# Patient Record
Sex: Male | Born: 1970 | Race: Black or African American | Hispanic: No | Marital: Single | Smoking: Former smoker
Health system: Southern US, Community
[De-identification: ages and names within clinical notes are randomized; demographics above are authoritative.]

## PROBLEM LIST (undated history)

## (undated) DIAGNOSIS — H409 Unspecified glaucoma: Secondary | ICD-10-CM

## (undated) DIAGNOSIS — D649 Anemia, unspecified: Secondary | ICD-10-CM

## (undated) DIAGNOSIS — T7840XA Allergy, unspecified, initial encounter: Secondary | ICD-10-CM

## (undated) HISTORY — DX: Allergy, unspecified, initial encounter: T78.40XA

## (undated) HISTORY — DX: Anemia, unspecified: D64.9

## (undated) HISTORY — DX: Unspecified glaucoma: H40.9

---

## 2002-10-17 ENCOUNTER — Emergency Department (HOSPITAL_COMMUNITY): Admission: EM | Admit: 2002-10-17 | Discharge: 2002-10-17 | Payer: Self-pay | Admitting: Emergency Medicine

## 2011-04-06 ENCOUNTER — Other Ambulatory Visit: Payer: Self-pay | Admitting: Physician Assistant

## 2011-05-09 ENCOUNTER — Ambulatory Visit (INDEPENDENT_AMBULATORY_CARE_PROVIDER_SITE_OTHER): Admitting: Family Medicine

## 2011-05-09 VITALS — BP 124/77 | HR 54 | Temp 98.2°F | Resp 16 | Ht 70.25 in | Wt 237.4 lb

## 2011-05-09 DIAGNOSIS — J019 Acute sinusitis, unspecified: Secondary | ICD-10-CM

## 2011-05-09 DIAGNOSIS — M545 Low back pain, unspecified: Secondary | ICD-10-CM

## 2011-05-09 DIAGNOSIS — M5430 Sciatica, unspecified side: Secondary | ICD-10-CM

## 2011-05-09 DIAGNOSIS — J069 Acute upper respiratory infection, unspecified: Secondary | ICD-10-CM

## 2011-05-09 DIAGNOSIS — M5432 Sciatica, left side: Secondary | ICD-10-CM

## 2011-05-09 MED ORDER — FLUTICASONE PROPIONATE 50 MCG/ACT NA SUSP: 2.0000 | Freq: Every day | NASAL | Status: AC

## 2011-05-09 MED ORDER — AMOXICILLIN 875 MG PO TABS: 875.0000 mg | ORAL_TABLET | Freq: Two times a day (BID) | ORAL | Status: AC

## 2011-05-09 MED ORDER — PREDNISONE 20 MG PO TABS: ORAL_TABLET | ORAL | Status: DC

## 2011-05-09 MED ORDER — OXAPROZIN 600 MG PO TABS: ORAL_TABLET | ORAL | Status: DC

## 2011-05-09 NOTE — Progress Notes (Signed)
Subjective: Patient has had several weeks of right upper Sartor infection. It is gone through phases of being clear to yellow and bloody and brown. He just stayed constantly congested and sniffling. He has not been on any major medications for this. He does not smoke. He works a Office manager. His girlfriend is starting to get some respiratory tract infection now to  He has had low back pain problems since he came back from [place] 6 years ago. He has radiation of the pain down the back of his left leg. Actually it has some numbness to his left foot. This has persisted, and he has to take caution on Lollie Sails uses his leg and how quickly move some things to try to keep from hurting it worse. He has a card for the [place], but has not had his back fully checked out yet. Last year I gave him a course of prednisone and he had some improvement from that.  Objective: Looks and sounds congested. TMs are normal. Just a small amount of wax in ear canals. Nose very congested. Throat was clear. Neck supple without significant nodes. Chest is clear to auscultation. Heart regular without murmurs. Very stopped up in his sinuses but not particularly tender over them.  Abdomen was soft extremities unremarkable but straight leg raising test causes some discomfort at about 70 or 80 on the left he tightens up a little bit.  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  Plan: We'll treat the sinusitis and will give him a course of prednisone for his back which should help the sinuses also. If he is getting worse he is to come back and. If he is able to he is to go [facility].  Onalee Hua a handwritten excuse through Wednesday for him not have to run at school he is at. If his back is bothering him too much or his sinuses are still not cleared considerably we would have to extend this for a few days. Thank you

## 2011-05-09 NOTE — Patient Instructions (Signed)
Sinusitis Sinuses are air pockets within the bones of your face. The growth of bacteria within a sinus leads to infection. The infection prevents the sinuses from draining. This infection is called sinusitis. SYMPTOMS  There will be different areas of pain depending on which sinuses have become infected.  The maxillary sinuses often produce pain beneath the eyes.   Frontal sinusitis may cause pain in the middle of the forehead and above the eyes.  Other problems (symptoms) include:  Toothaches.   Colored, pus-like (purulent) drainage from the nose.   Swelling, warmth, and tenderness over the sinus areas may be signs of infection.  TREATMENT  Sinusitis is most often determined by an exam.X-rays may be taken. If x-rays have been taken, make sure you obtain your results or find out how you are to obtain them. Your caregiver may give you medications (antibiotics). These are medications that will help kill the bacteria causing the infection. You may also be given a medication (decongestant) that helps to reduce sinus swelling.  HOME CARE INSTRUCTIONS   Only take over-the-counter or prescription medicines for pain, discomfort, or fever as directed by your caregiver.   Drink extra fluids. Fluids help thin the mucus so your sinuses can drain more easily.   Applying either moist heat or ice packs to the sinus areas may help relieve discomfort.   Use saline nasal sprays to help moisten your sinuses. The sprays can be found at your local drugstore.  SEEK IMMEDIATE MEDICAL CARE IF:  You have a fever.   You have increasing pain, severe headaches, or toothache.   You have nausea, vomiting, or drowsiness.   You develop unusual swelling around the face or trouble seeing.  MAKE SURE YOU:   Understand these instructions.   Will watch your condition.   Will get help right away if you are not doing well or get worse.  Document Released: 02/16/2005 Document Revised: 02/05/2011 Document Reviewed:  09/15/2006 [facility] Patient Information 2012 [place], [place].   Sciatica Sciatica is a weakness and/or changes in sensation (tingling, jolts, hot and cold, numbness) along the path the sciatic nerve travels. Irritation or damage to lumbar nerve roots is often also referred to as lumbar radiculopathy.  Lumbar radiculopathy (Sciatica) is the most common form of this problem. Radiculopathy can occur in any of the nerves coming out of the spinal cord. The problems caused depend on which nerves are involved. The sciatic nerve is the large nerve supplying the branches of nerves going from the hip to the toes. It often causes a numbness or weakness in the skin and/or muscles that the sciatic nerve serves. It also may cause symptoms (problems) of pain, burning, tingling, or electric shock-like feelings in the path of this nerve. This usually comes from injury to the fibers that make up the sciatic nerve. Some of these symptoms are low back pain and/or unpleasant feelings in the following areas:  From the mid-buttock down the back of the leg to the back of the knee.   And/or the outside of the calf and top of the foot.   And/or behind the inner ankle to the sole of the foot.  CAUSES   Herniated or slipped disc. Discs are the little cushions between the bones in the back.   Pressure by the piriformis muscle in the buttock on the sciatic nerve (Piriformis Syndrome).   Misalignment of the bones in the lower back and buttocks (Sacroiliac Joint Derangement).   Narrowing of the spinal canal that puts pressure  on or pinches the fibers that make up the sciatic nerve.   A slipped vertebra that is out of line with those above or beneath it.   Abnormality of the nervous system itself so that nerve fibers do not transmit signals properly, especially to feet and calves (neuropathy).   Tumor (this is rare).  Your caregiver can usually determine the cause of your sciatica and begin the treatment most likely to  help you. TREATMENT  Taking over-the-counter painkillers, physical therapy, rest, exercise, spinal manipulation, and injections of anesthetics and/or steroids may be used. Surgery, acupuncture, and Yoga can also be effective. Mind over matter techniques, mental imagery, and changing factors such as your bed, chair, desk height, posture, and activities are other treatments that may be helpful. You and your caregiver can help determine what is best for you. With proper diagnosis, the cause of most sciatica can be identified and removed. Communication and cooperation between your caregiver and you is essential. If you are not successful immediately, do not be discouraged. With time, a proper treatment can be found that will make you comfortable. HOME CARE INSTRUCTIONS   If the pain is coming from a problem in the back, applying ice to that area for 15 to 20 minutes, 3 to 4 times per day while awake, may be helpful. Put the ice in a plastic bag. Place a towel between the bag of ice and your skin.   You may exercise or perform your usual activities if these do not aggravate your pain, or as suggested by your caregiver.   Only take over-the-counter or prescription medicines for pain, discomfort, or fever as directed by your caregiver.   If your caregiver has given you a follow-up appointment, it is very important to keep that appointment. Not keeping the appointment could result in a chronic or permanent injury, pain, and disability. If there is any problem keeping the appointment, you must call back to this facility for assistance.  SEEK IMMEDIATE MEDICAL CARE IF:   You experience loss of control of bowel or bladder.   You have increasing weakness in the trunk, buttocks, or legs.   There is numbness in any areas from the hip down to the toes.   You have difficulty walking or keeping your balance.   You have any of the above, with fever or forceful vomiting.  Document Released: 02/10/2001 Document  Revised: 02/05/2011 Document Reviewed: 09/30/2007 [facility] Patient Information 2012 [place], [place].

## 2012-03-04 ENCOUNTER — Other Ambulatory Visit: Payer: Self-pay | Admitting: Family Medicine

## 2012-04-28 ENCOUNTER — Ambulatory Visit (INDEPENDENT_AMBULATORY_CARE_PROVIDER_SITE_OTHER): Admitting: Family Medicine

## 2012-04-28 VITALS — BP 127/80 | HR 75 | Temp 98.0°F | Resp 16 | Ht 70.5 in | Wt 238.0 lb

## 2012-04-28 DIAGNOSIS — G4733 Obstructive sleep apnea (adult) (pediatric): Secondary | ICD-10-CM

## 2012-04-28 DIAGNOSIS — IMO0002 Reserved for concepts with insufficient information to code with codable children: Secondary | ICD-10-CM

## 2012-04-28 DIAGNOSIS — S76219A Strain of adductor muscle, fascia and tendon of unspecified thigh, initial encounter: Secondary | ICD-10-CM

## 2012-04-28 NOTE — Patient Instructions (Addendum)
Let me know if you [facility]  Try and rest the groin.  Take ibuprofen 800 mg maximum of 3 times in 24 hours for pain and inflammation

## 2012-04-28 NOTE — Progress Notes (Signed)
Subjective: Patient is here with 2 main complaints. He has, a very bad snorer. He says he shakes the house. Others have reported that he stops breathing sometimes during his sleep. He does have daytime drowsiness. He is overweight. He does a lot of non-cardio exercises.  His second problem is that he was pushing a dresser this past weekend and pulled his left groin. He continues to hurt on the left medial thigh whenever he twists his leg or moves in certain positions. He is walking with a limp rate he realizes that it will take some time for this to heal, but he is supposed to go to [employer] reserves week starting this weekend. He realizes that he cannot do the running or leg work that he may need to. He would like an excuse for that.  Objective: He does walk with a limp, favoring the left leg. No hernias. Testes normal. He is tender on his left medial thigh, a couple of inches below the groin. External rotation and abduction seem to cause much pain.  Throat clear. Neck supple without nodes. Chest clear. Heart regular without murmurs. Abdomen soft nontender.  Assessment: Sleep apnea Left groin strain  Plan: Wrote a note for the [employer] Make a referral for sleep apnea testing. Retinal referral note that he can send to the [place] and see if he can get him to do it. If not he is to contact us and we will make a referral for a split study sleep test here in  [place].

## 2012-07-11 ENCOUNTER — Ambulatory Visit

## 2012-07-12 ENCOUNTER — Telehealth: Payer: Self-pay

## 2012-07-12 ENCOUNTER — Ambulatory Visit: Admitting: Physician Assistant

## 2012-07-12 VITALS — BP 126/92 | HR 91 | Temp 98.7°F | Resp 16 | Ht 69.75 in | Wt 229.0 lb

## 2012-07-12 DIAGNOSIS — E119 Type 2 diabetes mellitus without complications: Secondary | ICD-10-CM

## 2012-07-12 DIAGNOSIS — R0989 Other specified symptoms and signs involving the circulatory and respiratory systems: Secondary | ICD-10-CM

## 2012-07-12 DIAGNOSIS — R7309 Other abnormal glucose: Secondary | ICD-10-CM

## 2012-07-12 DIAGNOSIS — R0683 Snoring: Secondary | ICD-10-CM

## 2012-07-12 DIAGNOSIS — R739 Hyperglycemia, unspecified: Secondary | ICD-10-CM

## 2012-07-12 DIAGNOSIS — H409 Unspecified glaucoma: Secondary | ICD-10-CM | POA: Insufficient documentation

## 2012-07-12 DIAGNOSIS — E7800 Pure hypercholesterolemia, unspecified: Secondary | ICD-10-CM

## 2012-07-12 LAB — LIPID PANEL
HDL: 41 mg/dL (ref >39)
Total CHOL/HDL Ratio: 5.3 Ratio
VLDL: 16 mg/dL (ref 0–40)

## 2012-07-12 LAB — COMPREHENSIVE METABOLIC PANEL
ALT: 17 U/L (ref 0–53)
AST: 14 U/L (ref 0–37)
CO2: 28 mEq/L (ref 19–32)
Calcium: 10.2 mg/dL (ref 8.4–10.5)
Chloride: 100 mEq/L (ref 96–112)
Creat: 1 mg/dL (ref 0.50–1.35)
Potassium: 4.3 mEq/L (ref 3.5–5.3)
Sodium: 138 mEq/L (ref 135–145)
Total Protein: 7.7 g/dL (ref 6.0–8.3)

## 2012-07-12 MED ORDER — METFORMIN HCL ER 500 MG PO TB24: 500.0000 mg | ORAL_TABLET | Freq: Every day | ORAL | Status: DC

## 2012-07-12 NOTE — Telephone Encounter (Signed)
THIS MESSAGE IS TO [name]:   Shawn Pollard CALLED YOU BACK. THE NAME OF HIS GLUCOSE METER IS CONTOUR. THE LANCES - THE STRIP ON THE SIDE HAS A NUMBER OF [id number]. HE WOULD LIKE YOU TO CALL IT INTO THE [facility] ON [address]. HE ALSO NEEDS IT SENT TO HIS [employer]. HIS SS # IS [id number]. HIS [employer] ID# IS: [id number]. IF YOU NEEDS ANY OTHER INFORMATION REGARDING THE [employer], PLEASE CALL HIM. BEST PHONE [phone] (CELL)  PHARMACY CHOICE IS [facility] ON [address].   MBC

## 2012-07-12 NOTE — Progress Notes (Signed)
   [address], [place] [address]   Phone [phone]  Subjective:    Patient ID: Shawn Pollard, male    DOB: April 28, 1970, 42 y.o.   MRN: [id number]  HPI  Pt presents to clinic for repeat labs.  He had labs done in March at the [facility] for a physical and his glucose and cholesterol were high. He would like to have them repeated.  He has been checking his sugars and some days they are >150 other days low 100s fasting.  The other day he had a reading 270.  He has changed some of how he eats, less sweets and more fruits and veggies.  He has not lost any weight.  He sees an eye Dr for his glaucoma and he stated that he had some changes consistent with diabetes.  Pt is unable to get a sleep study through the [place] so he would like [place] to set that up.  Review of Systems  Eyes: Negative for visual disturbance.  Cardiovascular: Negative for chest pain.  Neurological: Negative for numbness (foot).       Objective:   Physical Exam  Vitals reviewed. Constitutional: He is oriented to person, place, and time. He appears well-developed and well-nourished.  HENT:  Head: Normocephalic and atraumatic.  Right Ear: External ear normal.  Left Ear: External ear normal.  Pulmonary/Chest: Effort normal.  Neurological: He is alert and oriented to person, place, and time.  Skin: Skin is warm and dry.  Psychiatric: He has a normal mood and affect. His behavior is normal. Judgment and thought content normal.      Assessment & Plan:  Hyperglycemia - Plan: POCT glycosylated hemoglobin (Hb A1C), Comprehensive metabolic panel  Hypercholesterolemia - will wait for his labs to return but I expect pt to need medications - Plan: Lipid panel  Snoring - Plan: Ambulatory referral to Sleep Studies  Diabetes mellitus, type - new diagnosis d/w pt at length eating strategies and pt will them for 3 months and then he will f/u with me - if he would llike more help at that time he we refer him to DM nutrition counseling - Plan:  metFORMIN (GLUCOPHAGE XR) 500 MG 24 hr tablet (he will start with a pill qd for the 1st week and then he will increase to bid).  He will call me with his meter type so we can refill his strips.  He will RTC in 3 months - and then we will probably add lisinopril at that time as well as get his Pneumovax.  Pt had an EKG at the [place] and I have scanned a copy into our chart for reference.  Benny Lennert PA-C 07/12/2012 4:22 PM

## 2012-07-13 MED ORDER — ATORVASTATIN CALCIUM 20 MG PO TABS: 20.0000 mg | ORAL_TABLET | Freq: Every day | ORAL | Status: DC

## 2012-07-13 MED ORDER — GLUCOSE BLOOD VI STRP: ORAL_STRIP | Status: DC

## 2012-07-13 MED ORDER — ONETOUCH ULTRASOFT LANCETS MISC: Status: DC

## 2012-07-13 NOTE — Telephone Encounter (Signed)
Printed these will fax.

## 2012-07-13 NOTE — Addendum Note (Signed)
Addended by: Morrell Riddle on: 07/13/2012 10:40 AM   Modules accepted: Orders

## 2012-07-15 ENCOUNTER — Ambulatory Visit: Admitting: Family Medicine

## 2012-09-27 ENCOUNTER — Institutional Professional Consult (permissible substitution): Admitting: Neurology

## 2012-09-27 ENCOUNTER — Encounter: Payer: Self-pay | Admitting: Neurology

## 2012-10-06 ENCOUNTER — Other Ambulatory Visit: Payer: Self-pay | Admitting: Physician Assistant

## 2012-10-07 NOTE — Telephone Encounter (Signed)
Needs office visit.

## 2012-10-24 ENCOUNTER — Encounter: Payer: Self-pay | Admitting: Physician Assistant

## 2012-11-26 ENCOUNTER — Other Ambulatory Visit: Payer: Self-pay | Admitting: Physician Assistant

## 2012-12-15 ENCOUNTER — Telehealth: Payer: Self-pay

## 2012-12-15 NOTE — Telephone Encounter (Signed)
Pt would like for someone to call him back to see if Weber needs to see him on like a 3 or 6 month bases Call back number is [phone]

## 2012-12-15 NOTE — Telephone Encounter (Signed)
When Shawn Pollard last saw him, she indicated recheck in 3 months. Called him to advise. He was transferred to make appt.

## 2012-12-16 ENCOUNTER — Other Ambulatory Visit: Payer: Self-pay

## 2012-12-16 DIAGNOSIS — E119 Type 2 diabetes mellitus without complications: Secondary | ICD-10-CM

## 2012-12-16 MED ORDER — METFORMIN HCL ER 500 MG PO TB24: 500.0000 mg | ORAL_TABLET | Freq: Two times a day (BID) | ORAL | Status: DC

## 2012-12-16 NOTE — Telephone Encounter (Signed)
Pt has appt sch for 02/08/13. I am sending in RF of metformin

## 2013-01-05 ENCOUNTER — Other Ambulatory Visit: Payer: Self-pay

## 2013-02-08 ENCOUNTER — Ambulatory Visit (INDEPENDENT_AMBULATORY_CARE_PROVIDER_SITE_OTHER): Admitting: Physician Assistant

## 2013-02-08 ENCOUNTER — Encounter: Payer: Self-pay | Admitting: Physician Assistant

## 2013-02-08 VITALS — BP 110/80 | HR 54 | Temp 98.1°F | Resp 16 | Ht 70.0 in | Wt 214.0 lb

## 2013-02-08 DIAGNOSIS — R0683 Snoring: Secondary | ICD-10-CM

## 2013-02-08 DIAGNOSIS — E7800 Pure hypercholesterolemia, unspecified: Secondary | ICD-10-CM

## 2013-02-08 DIAGNOSIS — Z23 Encounter for immunization: Secondary | ICD-10-CM

## 2013-02-08 DIAGNOSIS — R0609 Other forms of dyspnea: Secondary | ICD-10-CM

## 2013-02-08 DIAGNOSIS — E119 Type 2 diabetes mellitus without complications: Secondary | ICD-10-CM

## 2013-02-08 LAB — POCT GLYCOSYLATED HEMOGLOBIN (HGB A1C): Hemoglobin A1C: 6.1

## 2013-02-08 LAB — COMPREHENSIVE METABOLIC PANEL
ALT: 16 U/L (ref 0–53)
Alkaline Phosphatase: 47 U/L (ref 39–117)
CO2: 26 mEq/L (ref 19–32)
Creat: 0.93 mg/dL (ref 0.50–1.35)
Total Bilirubin: 0.6 mg/dL (ref 0.3–1.2)

## 2013-02-08 LAB — LIPID PANEL
Cholesterol: 127 mg/dL (ref 0–200)
LDL Cholesterol: 78 mg/dL (ref 0–99)
Total CHOL/HDL Ratio: 3.2 Ratio
Triglycerides: 43 mg/dL (ref <150)
VLDL: 9 mg/dL (ref 0–40)

## 2013-02-08 MED ORDER — LISINOPRIL 2.5 MG PO TABS: 2.5000 mg | ORAL_TABLET | Freq: Every day | ORAL | Status: DC

## 2013-02-08 MED ORDER — GLUCOSE BLOOD VI STRP: ORAL_STRIP | Status: DC

## 2013-02-08 MED ORDER — METFORMIN HCL ER 500 MG PO TB24: 500.0000 mg | ORAL_TABLET | Freq: Every day | ORAL | Status: DC

## 2013-02-08 NOTE — Progress Notes (Signed)
   Subjective:    Patient ID: Shawn Pollard, male    DOB: [birth year], 42 y.o.   MRN: [id number]  HPI  Pt presents to clinic for recheck.  He has changed his diet significantly and not exercised.  He feels good.  He never went to his sleep study but he has noticed that his snoring has decreased but it is still present and he would like to be referred again for a sleep study.  Review of Systems  Cardiovascular: Negative for chest pain.  Neurological: Negative for numbness.       Objective:   Physical Exam  Vitals reviewed. Constitutional: He is oriented to person, place, and time. He appears well-developed and well-nourished.  HENT:  Head: Normocephalic and atraumatic.  Right Ear: External ear normal.  Left Ear: External ear normal.  Eyes: Conjunctivae are normal.  Neck: Normal range of motion.  Cardiovascular: Normal rate, regular rhythm and normal heart sounds.   No murmur heard. Pulmonary/Chest: Effort normal and breath sounds normal.  Neurological: He is alert and oriented to person, place, and time.  Skin: Skin is warm and dry.  Psychiatric: He has a normal mood and affect. His behavior is normal. Judgment and thought content normal.       Assessment & Plan:  DM type 2 (diabetes mellitus, type 2) - Controlled - Continue daily metformin dose and we will add Lisinopril (very low dose due to normal BP) for kidney protection.  Plan: POCT glycosylated hemoglobin (Hb A1C), metFORMIN (GLUCOPHAGE XR) 500 MG 24 hr tablet, glucose blood test strip, lisinopril (ZESTRIL) 2.5 MG tablet  Pure hypercholesterolemia - check labs and we will adjust medication if necessary.  Plan: Comprehensive metabolic panel, Lipid panel  Snoring - Pt had to cancel his appt but he would like to be referred again.  Plan: Ambulatory referral to Sleep Studies  Flu vaccine need - Plan: Flu Vaccine QUAD 36+ mos IM  Need for pneumococcal vaccine - Plan: Pneumococcal polysaccharide vaccine 23-valent greater than  or equal to 2yo subcutaneous/IM  Recheck in 3 months.  Benny Lennert PA-C 02/08/2013 3:58 PM

## 2013-02-08 NOTE — Patient Instructions (Signed)
Diabetes is well controlled.

## 2013-02-09 ENCOUNTER — Other Ambulatory Visit: Payer: Self-pay | Admitting: Physician Assistant

## 2013-02-09 MED ORDER — ATORVASTATIN CALCIUM 20 MG PO TABS: 20.0000 mg | ORAL_TABLET | Freq: Every day | ORAL | Status: DC

## 2013-02-12 ENCOUNTER — Other Ambulatory Visit: Payer: Self-pay | Admitting: Physician Assistant

## 2013-03-24 ENCOUNTER — Encounter: Payer: Self-pay | Admitting: Neurology

## 2013-03-24 ENCOUNTER — Ambulatory Visit (INDEPENDENT_AMBULATORY_CARE_PROVIDER_SITE_OTHER): Admitting: Neurology

## 2013-03-24 VITALS — BP 114/71 | HR 62 | Resp 17 | Ht 71.0 in | Wt 218.0 lb

## 2013-03-24 DIAGNOSIS — R0609 Other forms of dyspnea: Secondary | ICD-10-CM

## 2013-03-24 DIAGNOSIS — G4710 Hypersomnia, unspecified: Secondary | ICD-10-CM

## 2013-03-24 DIAGNOSIS — R0989 Other specified symptoms and signs involving the circulatory and respiratory systems: Secondary | ICD-10-CM

## 2013-03-24 DIAGNOSIS — R0683 Snoring: Secondary | ICD-10-CM

## 2013-03-24 DIAGNOSIS — G4730 Sleep apnea, unspecified: Secondary | ICD-10-CM

## 2013-03-24 DIAGNOSIS — G4726 Circadian rhythm sleep disorder, shift work type: Secondary | ICD-10-CM

## 2013-03-24 NOTE — Patient Instructions (Signed)
Sleep Apnea  Sleep apnea is disorder that affects a person's sleep. A person with sleep apnea has abnormal pauses in their breathing when they sleep. It is hard for them to get a good sleep. This makes a person tired during the day. It also can lead to other physical problems. There are three types of sleep apnea. One type is when breathing stops for a short time because your airway is blocked (obstructive sleep apnea). Another type is when the brain sometimes fails to give the normal signal to breathe to the muscles that control your breathing (central sleep apnea). The third type is a combination of the other two types.  HOME CARE  · Do not sleep on your back. Try to sleep on your side.  · Take all medicine as told by your doctor.  · Avoid alcohol, calming medicines (sedatives), and depressant drugs.  · Try to lose weight if you are overweight. Talk to your doctor about a healthy weight goal.  Your doctor may have you use a device that helps to open your airway. It can help you get the air that you need. It is called a positive airway pressure (PAP) device. There are three types of PAP devices:  · Continuous positive airway pressure (CPAP) device.  · Nasal expiratory positive airway pressure (EPAP) device.  · Bilevel positive airway pressure (BPAP) device.  MAKE SURE YOU:  · Understand these instructions.  · Will watch your condition.  · Will get help right away if you are not doing well or get worse.  Document Released: 11/26/2007 Document Revised: 02/03/2012 Document Reviewed: 06/20/2011  [employer]® Patient Information ©2014 [employer].

## 2013-03-24 NOTE — Progress Notes (Signed)
[facility]  SLEEP MEDICINE CLINIC  Provider:  Melvyn Novasarmen  Malan Werk, M D  Referring Provider: Larkin InaWeber, Sarah L, PA-C Primary Care Physician:  No primary provider on file.  Chief Complaint  Patient presents with  . New Evaluation    Room 10  . Snoring    HPI:  Shawn Pollard is a 43 y.o. male  Is seen here as a referral/ revisit  from Dr. Valarie ConesWeber for evaluation of sleep apnea.    Mr. Shawn Pollard use to weigh a lot more than he does today, his current body mass index is 31 at the time he was originally referred for a sleep study in 2013 his body mass index was 35. He has made a conscious effort to lose weight. Recently been seen by his primary care physician he was asked about his sleep and he noticed that his snoring has decreased, he has consciously avoided the supine sleep position. He sometimes wakes with a dry mouth. There is no chest pain nor choking sensation. Mr. Shawn Pollard's has type 2 diabetes and a history of of hypercholesterolemia but has no over the last he has significantly decreased and he has reached normal cholesterol levels. His HbA1c is 6.1. His maternal grandmother had sleep apnea and the patient is concerned that he still may have this condition as well. So he was referred for a sleep study by Benny LennertSarah Weber.  The patient is a shift Financial controllerworker. He sleeps in a quiet ,cool room. He sleeps between 5 and 7 hours per day, his work begins between 11 PM and 2 AM and he tends to sleep between  6 PM and 11 PM/ midnight. He is typically at work 3 AM. He will have one cup of coffee , no tea and sodas. History small slit along his fiance made joined later rigorously.Marland Kitchen. He may be interrupted in her sleep by a phone call from his employer. He is on call. 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  He is used to taking Micronaps  of 10 minutes and feels refreshed.  Review of Systems: Out of a complete 14 system review, the patient  complains of only the following symptoms, and all other reviewed systems are negative. Sleepiness and snoring were endorsed. The fatigue severity questionnaire was endorsed at 9 points and the Epworth sleepiness score was endorsed 8 points. Fiance has noted that he snores less since he lost weight and avoid the supine position.  History   Social History  . Marital Status: Single    Spouse Name: N/A    Number of Children: 4  . Years of Education: Bachelors   Occupational History  .  [employer]   Social History Main Topics  . Smoking status: Former Games developermoker  . Smokeless tobacco: Never Used  . Alcohol Use: Yes  . Drug Use: No  . Sexual Activity: Yes    Birth Control/ Protection: Condom   Other Topics Concern  . Not on file   Social History Narrative   Patient is single.   Patient is working full-time.   Patient has four children.   Patient has a Bachelor's degree.   Patient is right-handed.   Patient drinks one cup of coffee daily.    Family History  Problem Relation Age of Onset  . Diabetes Mother   . Breast cancer Mother   . Glaucoma Mother   . Arthritis Mother   . Gout Mother   .  Diabetes Father   . Prostate cancer Father   . Glaucoma Father   . Arthritis Father   . Gout Father   . Stroke Father   . Seizures Father   . Heart attack Paternal Aunt   . Heart attack Paternal Uncle   . Prostate cancer Paternal Uncle   . Diabetes Paternal Grandmother   . Gout Paternal Grandmother     Past Medical History  Diagnosis Date  . Allergy   . Anemia   . Glaucoma     History reviewed. No pertinent past surgical history.  Current Outpatient Prescriptions  Medication Sig Dispense Refill  . atorvastatin (LIPITOR) 20 MG tablet Take 1 tablet (20 mg total) by mouth daily.  90 tablet  1  . azelastine (ASTELIN) 137 MCG/SPRAY nasal spray USE NASALLY AS DIRECTED  90 mL  1  . bimatoprost (LUMIGAN) 0.03 % ophthalmic solution Place 1 drop into both eyes 2 (two) times  daily.      . Brimonidine Tartrate-Timolol (COMBIGAN OP) Apply 1 drop to eye daily.      Marland Kitchen glucose blood test strip Use as instructed  100 each  12  . Lancets (ONETOUCH ULTRASOFT) lancets Use as instructed  100 each  12  . lisinopril (ZESTRIL) 2.5 MG tablet Take 1 tablet (2.5 mg total) by mouth daily.  90 tablet  0  . metFORMIN (GLUCOPHAGE XR) 500 MG 24 hr tablet Take 1 tablet (500 mg total) by mouth daily with breakfast.  90 tablet  0  . valACYclovir (VALTREX) 1000 MG tablet TAKE 1/2 TABLET BY MOUTH DAILY FOR 3 DAYS AS NEEDED FOR OUTBREAK  30 tablet  1   No current facility-administered medications for this visit.    Allergies as of 03/24/2013  . (No Known Allergies)    Vitals: BP 114/71  Pulse 62  Resp 17  Ht 5\' 11"  (1.803 m)  Wt 218 lb (98.884 kg)  BMI 30.42 kg/m2 Last Weight:  Wt Readings from Last 1 Encounters:  03/24/13 218 lb (98.884 kg)   Last Height:   Ht Readings from Last 1 Encounters:  03/24/13 5\' 11"  (1.803 m)    Physical exam:  General: The patient is awake, alert and appears not in acute distress. The patient is well groomed. Head: Normocephalic, atraumatic. Neck is supple. Mallampati 3, neck circumference: 15, TMJ , Mallampati 2 with lateral restriction by pillars/tonsil.  Retrognathia.  Cardiovascular:  Regular rate and rhythm , without murmurs or carotid bruit, and without distended neck veins. Respiratory: Lungs are clear to auscultation. Skin:  Without evidence of edema, or rash, residual hyperpigmentation.  Trunk:patient  has normal posture.  Neurologic exam : The patient is awake and alert, oriented to place and time.  Memory subjective described as intact.  There is a normal attention span & concentration ability.  Speech is fluent without dysarthria, dysphonia or aphasia. Mood and affect are appropriate.  Cranial nerves: Pupils are equal and briskly reactive to light. Funduscopic exam without evidence of pallor or edema.  Extraocular movements   in vertical and horizontal planes intact and without nystagmus. Visual fields by finger perimetry are intact. Hearing to finger rub intact.  Facial sensation intact to fine touch.  Facial motor strength is symmetric and tongue and uvula move midline.  Motor exam:  Normal tone and muscle bulk and symmetric strength in all extremities. Excellent grip strength.  Sensory:  Fine touch, pinprick and vibration were tested in all extremities. Proprioception is  normal.  Coordination: Rapid alternating movements  in the fingers/hands is tested and normal.  Finger-to-nose maneuver tested and normal without evidence of ataxia, dysmetria or tremor.  Gait and station: Patient walks without assistive device and is able and assisted stool climb up to the exam table. Strength within normal limits.  Deep tendon reflexes: in the  upper and lower extremities are symmetric and intact.    Assessment:  After physical and neurologic examination, review of laboratory studies, imaging, neurophysiology testing and pre-existing records, assessment is :  Mr. Tino has lowered his risk of developing obstructive sleep apnea significantly by losing weight.  However his fiance has still not his snoring and that why he sleeps on his side. He also has a restricted upper airway this is mostly a lateral restriction tonsillary tissue and lateral pillars.  He has a history of rhinitis and he has retrognathia , both are  Factors that promote mouth breathing.  Plan; I would like him in the past to undergo a sleep study. He will undergo a split-night, to be scored at 4% for [facility], no CO2 is needed, split at AHI 15. Please evaluate the patient in supine sleep the first hours of the study.        Plan:  Treatment plan and additional workup :

## 2013-04-13 ENCOUNTER — Ambulatory Visit (INDEPENDENT_AMBULATORY_CARE_PROVIDER_SITE_OTHER)

## 2013-04-13 DIAGNOSIS — R0683 Snoring: Secondary | ICD-10-CM

## 2013-04-13 DIAGNOSIS — G4730 Sleep apnea, unspecified: Secondary | ICD-10-CM

## 2013-04-13 DIAGNOSIS — G4733 Obstructive sleep apnea (adult) (pediatric): Secondary | ICD-10-CM

## 2013-04-13 DIAGNOSIS — G4710 Hypersomnia, unspecified: Secondary | ICD-10-CM

## 2013-04-13 DIAGNOSIS — G4726 Circadian rhythm sleep disorder, shift work type: Secondary | ICD-10-CM

## 2013-04-21 ENCOUNTER — Telehealth: Payer: Self-pay | Admitting: Neurology

## 2013-04-21 ENCOUNTER — Encounter: Payer: Self-pay | Admitting: *Deleted

## 2013-04-21 DIAGNOSIS — G4733 Obstructive sleep apnea (adult) (pediatric): Secondary | ICD-10-CM

## 2013-04-21 NOTE — Telephone Encounter (Signed)
I called and spoke with the patient about his recent sleep study results. I informed the patient that the study revealed mild obstructive sleep apnea but significant associated hypoxemia( lack of oxygen in the blood in the arteries). Dr. Vickey Hugerohmeier recommends CPAP therapy at home, so I will send his order for CPAP to [facility] who will contact his insurance carrier. I will fax a copy of the report to [facility] and mail the report to the patient along with a follow up instruction letter.

## 2013-05-17 ENCOUNTER — Ambulatory Visit (INDEPENDENT_AMBULATORY_CARE_PROVIDER_SITE_OTHER): Admitting: Physician Assistant

## 2013-05-17 ENCOUNTER — Encounter: Payer: Self-pay | Admitting: Physician Assistant

## 2013-05-17 VITALS — BP 121/78 | HR 60 | Temp 98.1°F | Resp 16 | Ht 70.0 in | Wt 218.2 lb

## 2013-05-17 DIAGNOSIS — J309 Allergic rhinitis, unspecified: Secondary | ICD-10-CM

## 2013-05-17 DIAGNOSIS — E7800 Pure hypercholesterolemia, unspecified: Secondary | ICD-10-CM | POA: Insufficient documentation

## 2013-05-17 DIAGNOSIS — G4730 Sleep apnea, unspecified: Secondary | ICD-10-CM

## 2013-05-17 DIAGNOSIS — E119 Type 2 diabetes mellitus without complications: Secondary | ICD-10-CM

## 2013-05-17 DIAGNOSIS — J302 Other seasonal allergic rhinitis: Secondary | ICD-10-CM

## 2013-05-17 LAB — POCT GLYCOSYLATED HEMOGLOBIN (HGB A1C): Hemoglobin A1C: 7

## 2013-05-17 MED ORDER — GLUCOSE BLOOD VI STRP: ORAL_STRIP | Status: AC

## 2013-05-17 MED ORDER — ATORVASTATIN CALCIUM 20 MG PO TABS: 20.0000 mg | ORAL_TABLET | Freq: Every day | ORAL | Status: DC

## 2013-05-17 MED ORDER — LISINOPRIL 2.5 MG PO TABS: 2.5000 mg | ORAL_TABLET | Freq: Every day | ORAL | Status: DC

## 2013-05-17 MED ORDER — ONETOUCH ULTRASOFT LANCETS MISC: Status: AC

## 2013-05-17 MED ORDER — AZELASTINE HCL 0.1 % NA SOLN: NASAL | Status: DC

## 2013-05-17 MED ORDER — METFORMIN HCL ER 500 MG PO TB24: 500.0000 mg | ORAL_TABLET | Freq: Every day | ORAL | Status: DC

## 2013-05-17 MED ORDER — ONETOUCH ULTRASOFT LANCETS MISC: Status: DC

## 2013-05-17 NOTE — Patient Instructions (Signed)
I will contact you with your lab results as soon as they are available.   If you have not heard from me in 2 weeks, please contact me.  The fastest way to get your results is to register for My Chart (see the instructions on the last page of this printout).   

## 2013-05-17 NOTE — Progress Notes (Signed)
   Subjective:    Patient ID: Shawn RaseJohn D Lobato, male    DOB: 08/15/1970, 43 y.o.   MRN: [id number]  HPI Pt presents to clinic for his DM check.  Over the last 3 months he has stopped his daily smoothie and has not eaten regular meals and he is curious how it affected his overall sugar control.  He did get fitted with a CPAP for his sleep apnea and feels much better now.  He is having no pain or sensation change in his feet.  Pt has seasonal allergies and he needs refills on his Astelin.  Review of Systems     Objective:   Physical Exam  Constitutional: He is oriented to person, place, and time. He appears well-developed and well-nourished.  HENT:  Head: Normocephalic and atraumatic.  Right Ear: External ear normal.  Left Ear: External ear normal.  Eyes: Conjunctivae are normal.  Neck: Normal range of motion.  Pulmonary/Chest: Effort normal.  Neurological: He is alert and oriented to person, place, and time.  Normal foot exam.  No skin breakdown or wounds.  Skin: Skin is warm and dry.  Psychiatric: He has a normal mood and affect. His behavior is normal. Judgment and thought content normal.   Results for orders placed in visit on 05/17/13  POCT GLYCOSYLATED HEMOGLOBIN (HGB A1C)      Result Value Ref Range   Hemoglobin A1C 7.0         Assessment & Plan:  DM type 2 (diabetes mellitus, type 2) - Plan: POCT glycosylated hemoglobin (Hb A1C), Microalbumin, urine, metFORMIN (GLUCOPHAGE XR) 500 MG 24 hr tablet, lisinopril (ZESTRIL) 2.5 MG tablet, glucose blood test strip, Lancets (ONETOUCH ULTRASOFT) lancets, HM DIABETES FOOT EXAM  Elevated cholesterol - Plan: COMPLETE METABOLIC PANEL WITH GFR, Lipid panel, atorvastatin (LIPITOR) 20 MG tablet  Unspecified sleep apnea - continue with CPAP  Seasonal allergies - Plan: azelastine (ASTELIN) 137 MCG/SPRAY nasal spray  Pt is going to try and improve eating habits and see how his A1C changes - he would like to know how much his diet effects his  glucose control.  Benny LennertSarah Weber PA-C  [facility] 05/17/2013 5:39 PM

## 2013-05-18 LAB — COMPLETE METABOLIC PANEL WITH GFR
ALBUMIN: 4.8 g/dL (ref 3.5–5.2)
ALT: 18 U/L (ref 0–53)
AST: 18 U/L (ref 0–37)
Alkaline Phosphatase: 43 U/L (ref 39–117)
BUN: 14 mg/dL (ref 6–23)
CO2: 24 mEq/L (ref 19–32)
Calcium: 9.7 mg/dL (ref 8.4–10.5)
Chloride: 100 mEq/L (ref 96–112)
Creat: 0.95 mg/dL (ref 0.50–1.35)
GFR, Est African American: >89 mL/min
GLUCOSE: 93 mg/dL (ref 70–99)
POTASSIUM: 3.9 meq/L (ref 3.5–5.3)
SODIUM: 136 meq/L (ref 135–145)
TOTAL PROTEIN: 7.4 g/dL (ref 6.0–8.3)
Total Bilirubin: 0.7 mg/dL (ref 0.2–1.2)

## 2013-05-18 LAB — LIPID PANEL
CHOLESTEROL: 111 mg/dL (ref 0–200)
HDL: 36 mg/dL — AB (ref >39)
LDL Cholesterol: 66 mg/dL (ref 0–99)
Total CHOL/HDL Ratio: 3.1 Ratio
Triglycerides: 46 mg/dL (ref <150)
VLDL: 9 mg/dL (ref 0–40)

## 2013-05-18 LAB — MICROALBUMIN, URINE: Microalb, Ur: 0.93 mg/dL (ref 0.00–1.89)

## 2013-07-05 ENCOUNTER — Encounter: Payer: Self-pay | Admitting: Neurology

## 2013-07-13 ENCOUNTER — Ambulatory Visit (INDEPENDENT_AMBULATORY_CARE_PROVIDER_SITE_OTHER): Admitting: Neurology

## 2013-07-13 ENCOUNTER — Encounter: Payer: Self-pay | Admitting: Neurology

## 2013-07-13 VITALS — BP 108/70 | HR 58 | Resp 18 | Ht 71.5 in | Wt 226.0 lb

## 2013-07-13 DIAGNOSIS — Z9989 Dependence on other enabling machines and devices: Principal | ICD-10-CM

## 2013-07-13 DIAGNOSIS — G4733 Obstructive sleep apnea (adult) (pediatric): Secondary | ICD-10-CM | POA: Insufficient documentation

## 2013-07-13 DIAGNOSIS — R0683 Snoring: Secondary | ICD-10-CM

## 2013-07-13 DIAGNOSIS — R0609 Other forms of dyspnea: Secondary | ICD-10-CM

## 2013-07-13 DIAGNOSIS — R0989 Other specified symptoms and signs involving the circulatory and respiratory systems: Secondary | ICD-10-CM

## 2013-07-13 NOTE — Addendum Note (Signed)
Addended by: Melvyn NovasHMEIER, Rhonin Trott on: 07/13/2013 04:24 PM   Modules accepted: Orders

## 2013-07-13 NOTE — Progress Notes (Addendum)
[facility]  SLEEP MEDICINE CLINIC  Provider:  Melvyn Novas, M D  Referring Provider: No ref. provider found Primary Care Physician:  Virgilio Belling  Chief Complaint  Patient presents with  . Follow-up    Room 10  . Snoring    HPI:  Shawn Pollard is a 43 y.o. male  Is seen here as a revisit  from Dr. Deforest Hoyles office  for evaluation of sleep apnea and thunderous snoring. Diagnosed with OSA and UARS , now on CPAP.   A split-night polysomnography was performed on 2 12-15 after this  Shift working gentleman with a strong family history of cardiac death  endorsed the Epworth sleepiness scale at 19 /of 24 points and a depression score at 2 points.  His BMI was 30.4 his neck circumference is 17 inches the patient has reported history of shift work and he is used to taking micron naps.  He also all had witnessed to be snoring waking up with a dry mouth and a sore throat. The main problem however was excessive daytime sleepiness. The study revealed an AHI of 10.6 which is mild but his upper airway with disturbance index was 30.3. In REM sleep his AHI was 23.5 and in supine sleep 10.6. He also had low oxygen saturations for prolonged periods of time at night the nadir during his study was 84% for fracture the 9.3 minute duration. There was no tachybradycardia arrhythmia noted the patient was titrated to 9 cm water which reduced his AHI of 1.0 and will see oxygen nadir to 89% when sleeping in supine position. He also all was able to enter REM sleep at a setting of 8 cm water.   He wakes up feeling refreshed and restored, but he still gets sleepy after a meal. He eats a low carb diet. Epworth 8 , FSS 15. Depression zero. his download over 64 days dated 07-05-13 documented excellent compliance , 6 hours and 18 minutes, residual AHI 1.3/ he asked for an increase in pressure. Compliance is 98% for 64 days.  This should qualify for DOT / satisfied.   Goes to bed 7 PM and rises at 12 Pm  to 2 AM , 4-5 hours of sleep on workdays. He drives  15 minutes to his job, On weekends sleeps about 7-8 hours.  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     Last vist, January 2015 with C.Abie Cheek :  Shawn Pollard use to weigh a lot more than he does today, his current body mass index is 31 at the time he was originally referred for a sleep study in 2013 his body mass index was 35. He has made a conscious effort to lose weight. Recently been seen by his primary care physician he was asked about his sleep and he noticed that his snoring has decreased, he has consciously avoided the supine sleep position. He sometimes wakes with a dry mouth. There is no chest pain nor choking sensation. Shawn Pollard has type 2 diabetes and a history of of hypercholesterolemia but has no over the last he has significantly decreased and he has reached normal cholesterol levels. His HbA1c is 6.1. His maternal grandmother had sleep apnea and the patient is concerned that he still may have this condition as well. So he was referred for a sleep study by Benny Lennert.  The patient is a shift Financial controller. He sleeps in a quiet ,cool room. He sleeps between 5 and 7 hours per day, his work begins between  11 PM and 2 AM and he tends to sleep between  6 PM and 11 PM/ midnight. He is typically at work 3 AM. He will have one cup of coffee , no tea and sodas. History small slit along his fiance made joined later rigorously.Marland Kitchen. He may be interrupted in her sleep by a phone call from his employer. He is on call. 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  He is used to taking Micronaps  of 10 minutes and feels refreshed.  Review of Systems: Out of a complete 14 system review, the patient complains of only the following symptoms, and all other reviewed systems are negative. Sleepiness and snoring were endorsed. The fatigue severity questionnaire was endorsed at 9 points and the Epworth  sleepiness score was endorsed 8 points. Fiance has noted that he snores less since he lost weight and avoid the supine position.  History   Social History  . Marital Status: Single    Spouse Name: N/A    Number of Children: 4  . Years of Education: Bachelors   Occupational History  .  [employer]   Social History Main Topics  . Smoking status: Former Games developermoker  . Smokeless tobacco: Never Used  . Alcohol Use: Yes  . Drug Use: No  . Sexual Activity: Yes    Birth Control/ Protection: Condom   Other Topics Concern  . Not on file   Social History Narrative   Patient is single.   Patient is working full-time.   Patient has four children.   Patient has a Bachelor's degree.   Patient is right-handed.   Patient drinks one cup of coffee daily.    Family History  Problem Relation Age of Onset  . Diabetes Mother   . Breast cancer Mother   . Glaucoma Mother   . Arthritis Mother   . Gout Mother   . Diabetes Father   . Prostate cancer Father   . Glaucoma Father   . Arthritis Father   . Gout Father   . Stroke Father   . Seizures Father   . Heart attack Paternal Aunt   . Heart attack Paternal Uncle   . Prostate cancer Paternal Uncle   . Diabetes Paternal Grandmother   . Gout Paternal Grandmother     Past Medical History  Diagnosis Date  . Allergy   . Anemia   . Glaucoma     History reviewed. No pertinent past surgical history.  Current Outpatient Prescriptions  Medication Sig Dispense Refill  . atorvastatin (LIPITOR) 20 MG tablet Take 1 tablet (20 mg total) by mouth daily.  90 tablet  1  . azelastine (ASTELIN) 137 MCG/SPRAY nasal spray USE NASALLY AS DIRECTED  90 mL  3  . bimatoprost (LUMIGAN) 0.03 % ophthalmic solution Place 1 drop into both eyes 2 (two) times daily.      . Brimonidine Tartrate-Timolol (COMBIGAN OP) Apply 1 drop to eye daily.      Marland Kitchen. glucose blood test strip Use as instructed  100 each  12  . Lancets (ONETOUCH ULTRASOFT) lancets Use as  instructed  100 each  12  . lisinopril (ZESTRIL) 2.5 MG tablet Take 1 tablet (2.5 mg total) by mouth daily.  90 tablet  0  . metFORMIN (GLUCOPHAGE XR) 500 MG 24 hr tablet Take 1 tablet (500 mg total) by mouth daily with breakfast.  90 tablet  0  . valACYclovir (VALTREX) 1000 MG  tablet TAKE 1/2 TABLET BY MOUTH DAILY FOR 3 DAYS AS NEEDED FOR OUTBREAK  30 tablet  1   No current facility-administered medications for this visit.    Allergies as of 07/13/2013  . (No Known Allergies)    Vitals: BP 108/70  Pulse 58  Resp 18  Ht 5' 11.5" (1.816 m)  Wt 226 lb (102.513 kg)  BMI 31.08 kg/m2 Last Weight:  Wt Readings from Last 1 Encounters:  07/13/13 226 lb (102.513 kg)   Last Height:   Ht Readings from Last 1 Encounters:  07/13/13 5' 11.5" (1.816 m)    Physical exam:  General: The patient is awake, alert and appears not in acute distress. The patient is well groomed. Head: Normocephalic, atraumatic. Neck is supple. Mallampati 3, neck circumference: 17.5 ,  TMJ , Mallampati 2 with lateral restriction by pillars/tonsils.  Retrognathia.  Cardiovascular:  Regular rate and rhythm, without murmurs or carotid bruit, and without distended neck veins. Respiratory: Lungs are clear to auscultation. Skin:  Without evidence of edema, or rash, residual hyperpigmentation.  Trunk:patient  has normal posture.  Neurologic exam : The patient is awake and alert, oriented to place and time.  Memory subjective described as intact.  There is a normal attention span & concentration ability.  Speech is fluent without dysarthria, dysphonia or aphasia. Mood and affect are appropriate.  Cranial nerves: Pupils are equal and briskly reactive to light. Funduscopic exam without evidence of pallor or edema.  Extraocular movements  in vertical and horizontal planes intact and without nystagmus. Visual fields by finger perimetry are intact. Hearing to finger rub intact.  Facial sensation intact to fine touch.   Facial motor strength is symmetric and tongue and uvula move midline.  Motor exam:  Normal tone and muscle bulk and symmetric strength in all extremities. Excellent grip strength.  Sensory:  Fine touch, pinprick and vibration were tested in all extremities. Proprioception is  normal.  Coordination: Rapid alternating movements in the fingers/hands is tested and normal.  Finger-to-nose maneuver tested and normal without evidence of ataxia, dysmetria or tremor.  Gait and station: Patient walks without assistive device and is able and assisted stool climb up to the exam table. Strength within normal limits.  Deep tendon reflexes: in the  upper and lower extremities are symmetric and intact.    Assessment:  After physical and neurologic examination, review of laboratory studies, imaging, neurophysiology testing and pre-existing records, assessment is : Mild OSA and moderate UARS, shift work sleep disorder. Patient has less excessive sleepiness on CPAP, but never had the lazarus effect.   Mr. Donavan Pollard has lowered his risk of worsening obstructive sleep apnea significantly by losing weight and exercising.  Plan; I would like him in the past to undergo a sleep study.   Plan:  Treatment plan and additional workup : continue CPAP at 8 cm water. No change in settings, [facility] . DME>

## 2013-07-13 NOTE — Patient Instructions (Signed)
Sleep Apnea  Sleep apnea is disorder that affects a person's sleep. A person with sleep apnea has abnormal pauses in their breathing when they sleep. It is hard for them to get a good sleep. This makes a person tired during the day. It also can lead to other physical problems. There are three types of sleep apnea. One type is when breathing stops for a short time because your airway is blocked (obstructive sleep apnea). Another type is when the brain sometimes fails to give the normal signal to breathe to the muscles that control your breathing (central sleep apnea). The third type is a combination of the other two types.  HOME CARE  · Do not sleep on your back. Try to sleep on your side.  · Take all medicine as told by your doctor.  · Avoid alcohol, calming medicines (sedatives), and depressant drugs.  · Try to lose weight if you are overweight. Talk to your doctor about a healthy weight goal.  Your doctor may have you use a device that helps to open your airway. It can help you get the air that you need. It is called a positive airway pressure (PAP) device. There are three types of PAP devices:  · Continuous positive airway pressure (CPAP) device.  · Nasal expiratory positive airway pressure (EPAP) device.  · Bilevel positive airway pressure (BPAP) device.  MAKE SURE YOU:  · Understand these instructions.  · Will watch your condition.  · Will get help right away if you are not doing well or get worse.  Document Released: 11/26/2007 Document Revised: 02/03/2012 Document Reviewed: 06/20/2011  [employer]® Patient Information ©2014 [employer].

## 2013-08-16 ENCOUNTER — Ambulatory Visit (INDEPENDENT_AMBULATORY_CARE_PROVIDER_SITE_OTHER): Admitting: Physician Assistant

## 2013-08-16 ENCOUNTER — Encounter: Payer: Self-pay | Admitting: Physician Assistant

## 2013-08-16 VITALS — BP 114/61 | HR 50 | Temp 98.2°F | Resp 16 | Ht 70.25 in | Wt 225.0 lb

## 2013-08-16 DIAGNOSIS — E7800 Pure hypercholesterolemia, unspecified: Secondary | ICD-10-CM

## 2013-08-16 DIAGNOSIS — E119 Type 2 diabetes mellitus without complications: Secondary | ICD-10-CM

## 2013-08-16 LAB — POCT GLYCOSYLATED HEMOGLOBIN (HGB A1C): Hemoglobin A1C: 6.7

## 2013-08-16 NOTE — Progress Notes (Signed)
   Subjective:    Patient ID: Shawn Pollard, male    DOB: 09/20/1970, 43 y.o.   MRN: [id number]  HPI  Pt presents to clinic for his diabetes recheck.  He has been doing good.  He has started his CPAP and loves it and feels so much better with its use.  He has been monitoring his blood glucose and it seems to be the highest in the morning - he eats and then quickly goes to bed - he does not wake up feeling poorly.  Review of Systems     Objective:   Physical Exam  Vitals reviewed. Constitutional: He is oriented to person, place, and time. He appears well-developed and well-nourished.  HENT:  Head: Normocephalic and atraumatic.  Right Ear: External ear normal.  Left Ear: External ear normal.  Eyes: Conjunctivae are normal.  Cardiovascular: Normal rate, regular rhythm and normal heart sounds.   No murmur heard. Pulmonary/Chest: Effort normal and breath sounds normal.  Musculoskeletal:  DM foot exam done.  Neurological: He is alert and oriented to person, place, and time.  Skin: Skin is warm and dry.  Psychiatric: He has a normal mood and affect. His behavior is normal. Judgment and thought content normal.   Results for orders placed in visit on 08/16/13  POCT GLYCOSYLATED HEMOGLOBIN (HGB A1C)      Result Value Ref Range   Hemoglobin A1C 6.7         Assessment & Plan:  DM type 2 (diabetes mellitus, type 2) - Plan: HM Diabetes Foot Exam, POCT glycosylated hemoglobin (Hb A1C)  Elevated cholesterol - Plan: Lipid panel  Diabetes mellitus type 2, controlled - Well controlled - continue current medications and eating habits - Plan: COMPLETE METABOLIC PANEL WITH GFR  Benny LennertSarah Weber PA-C  [facility] 08/16/2013 2:09 PM

## 2013-08-17 LAB — LIPID PANEL
CHOLESTEROL: 107 mg/dL (ref 0–200)
HDL: 40 mg/dL (ref >39)
LDL Cholesterol: 53 mg/dL (ref 0–99)
TRIGLYCERIDES: 69 mg/dL (ref <150)
Total CHOL/HDL Ratio: 2.7 Ratio
VLDL: 14 mg/dL (ref 0–40)

## 2013-08-17 LAB — COMPLETE METABOLIC PANEL WITH GFR
ALK PHOS: 43 U/L (ref 39–117)
ALT: 17 U/L (ref 0–53)
AST: 15 U/L (ref 0–37)
Albumin: 4.7 g/dL (ref 3.5–5.2)
BUN: 16 mg/dL (ref 6–23)
CALCIUM: 9.5 mg/dL (ref 8.4–10.5)
CHLORIDE: 101 meq/L (ref 96–112)
CO2: 28 mEq/L (ref 19–32)
CREATININE: 1.17 mg/dL (ref 0.50–1.35)
GFR, Est African American: 89 mL/min
GFR, Est Non African American: 77 mL/min
Glucose, Bld: 101 mg/dL — ABNORMAL HIGH (ref 70–99)
Potassium: 4.1 mEq/L (ref 3.5–5.3)
Sodium: 137 mEq/L (ref 135–145)
Total Bilirubin: 0.5 mg/dL (ref 0.2–1.2)
Total Protein: 7.5 g/dL (ref 6.0–8.3)

## 2013-08-23 ENCOUNTER — Ambulatory Visit: Admitting: Physician Assistant

## 2013-11-01 ENCOUNTER — Telehealth: Payer: Self-pay

## 2013-11-01 NOTE — Telephone Encounter (Signed)
Pt is due to follow up DM with Benny Lennert. Spoke to pt- he is going to come in tomorrow to follow up and do labs.

## 2013-11-01 NOTE — Telephone Encounter (Signed)
Pt states he is doing everything Maralyn Sago wants him to regarding his blood sugar and it is still high, didn't know if she wanted to change his meds or not Please call [phone]

## 2013-12-07 ENCOUNTER — Ambulatory Visit (INDEPENDENT_AMBULATORY_CARE_PROVIDER_SITE_OTHER): Admitting: Family Medicine

## 2013-12-07 ENCOUNTER — Ambulatory Visit (INDEPENDENT_AMBULATORY_CARE_PROVIDER_SITE_OTHER)

## 2013-12-07 VITALS — BP 110/72 | HR 60 | Temp 98.3°F | Resp 18 | Ht 71.5 in | Wt 230.0 lb

## 2013-12-07 DIAGNOSIS — E7800 Pure hypercholesterolemia, unspecified: Secondary | ICD-10-CM

## 2013-12-07 DIAGNOSIS — Z7984 Long term (current) use of oral hypoglycemic drugs: Secondary | ICD-10-CM

## 2013-12-07 DIAGNOSIS — M25552 Pain in left hip: Secondary | ICD-10-CM

## 2013-12-07 DIAGNOSIS — E118 Type 2 diabetes mellitus with unspecified complications: Secondary | ICD-10-CM

## 2013-12-07 DIAGNOSIS — Z23 Encounter for immunization: Secondary | ICD-10-CM

## 2013-12-07 DIAGNOSIS — E119 Type 2 diabetes mellitus without complications: Secondary | ICD-10-CM

## 2013-12-07 DIAGNOSIS — E785 Hyperlipidemia, unspecified: Secondary | ICD-10-CM

## 2013-12-07 DIAGNOSIS — J302 Other seasonal allergic rhinitis: Secondary | ICD-10-CM

## 2013-12-07 LAB — POCT GLYCOSYLATED HEMOGLOBIN (HGB A1C): Hemoglobin A1C: 7.4

## 2013-12-07 LAB — GLUCOSE, POCT (MANUAL RESULT ENTRY): POC Glucose: 122 mg/dL — AB (ref 70–99)

## 2013-12-07 MED ORDER — AZELASTINE HCL 0.1 % NA SOLN: NASAL | Status: AC

## 2013-12-07 MED ORDER — ATORVASTATIN CALCIUM 20 MG PO TABS: 20.0000 mg | ORAL_TABLET | Freq: Every day | ORAL | Status: AC

## 2013-12-07 MED ORDER — METFORMIN HCL ER 500 MG PO TB24: 500.0000 mg | ORAL_TABLET | Freq: Two times a day (BID) | ORAL | Status: DC

## 2013-12-07 MED ORDER — METFORMIN HCL 500 MG PO TABS: 500.0000 mg | ORAL_TABLET | Freq: Two times a day (BID) | ORAL | Status: DC

## 2013-12-07 MED ORDER — LISINOPRIL 2.5 MG PO TABS: 2.5000 mg | ORAL_TABLET | Freq: Every day | ORAL | Status: DC

## 2013-12-07 NOTE — Progress Notes (Signed)
Subjective:    Patient ID: Shawn Pollard, male    DOB: 09-08-1970, 43 y.o.   MRN: [id number]    HPI 43 year old male with a PMH of DM, hypercholesterolemia, glaucoma, and OSA is here today for his diabetes management follow up.   DM: Fasting glucose averages between 150-180.  He had one high yesterday morning of 244.  By 1 am it was 105.  He states his morning fast is the highest throughout the day.  He is compliant with his medication.  His diet consist of lean protein and vegetables.  He has restricted his beverages to only water.  He says his greatest issue is eating grapes.  He eats them before bedtime while watching TV.  He denies polyuria, blurriness, nausea, tremulousness, dizziness, slow healing wounds.  He has headaches rarely in the morning but spontaneously resolve as he goes about his day.  OSA: Using C-Pap daily with no complaints or concerns.    He has a concern for leg pain.  He states that he was diagnosed here with sciatica, and it has gotten increasingly worse.  He states that with running or any exercise, he has severe pain in his right hip joint this radiates down the leg.  He also notes some numbness and tingling in his toes with this hip pain that is dull in character and has anterior pain at the hip joint with palpation.  He has been required by the military to run, but he is requesting that he be exempt from this, due to the pain.        Review of Systems  Eyes: Negative for visual disturbance.  Cardiovascular: Negative for chest pain.  Gastrointestinal: Negative for nausea, vomiting, abdominal pain and diarrhea.  Endocrine: Negative for polyuria.  Skin: Negative for wound.  Neurological: Positive for numbness and headaches. Negative for tremors and weakness.       Objective:   Physical Exam  Constitutional: He appears well-developed and well-nourished.  HENT:  Head: Normocephalic and atraumatic.  Eyes: Conjunctivae are normal. Pupils are equal, round, and  reactive to light.  Cardiovascular: Normal rate, regular rhythm, normal heart sounds and intact distal pulses.  Exam reveals no gallop and no friction rub.   No murmur heard. Pulmonary/Chest: Effort normal and breath sounds normal. No respiratory distress.  Musculoskeletal: He exhibits tenderness (Tenderness with palpation at left anterior hip joint.  ). He exhibits no edema.       Left hip: He exhibits decreased range of motion (2/5 rom and pain with flexion, extension, abduction.  No pain with extended dorsiflexon.  No  tingling with movement.  ). He exhibits no bony tenderness, no swelling and no crepitus.   BP 110/72  Pulse 60  Temp(Src) 98.3 F (36.8 C) (Oral)  Resp 18  Ht 5' 11.5" (1.816 m)  Wt 230 lb (104.327 kg)  BMI 31.63 kg/m2  SpO2 98%  Results for orders placed in visit on 12/07/13  POCT GLYCOSYLATED HEMOGLOBIN (HGB A1C)      Result Value Ref Range   Hemoglobin A1C 7.4, elevated from 6.7 3 months ago   GLUCOSE, POCT (MANUAL RESULT ENTRY)      Result Value Ref Range   POC Glucose 122 (*) 70 - 99 mg/dl   Left Hip XRay [facility] reading (PRIMARY) by  Dr. Katrinka Blazing, Dud[place] Major      Assessment & Plan:  This is a 43 year old male with a history of DM2, hypercholesterolemia, OSA, glaucoma here today  for a DM follow up.  Type 2 diabetes mellitus with complication - Plan: POCT glycosylated hemoglobin (Hb A1C), POCT glucose (manual entry), metFORMIN (GLUCOPHAGE XR) 500 MG 24 hr tablet, lisinopril (ZESTRIL) 2.5 MG tablet, DISCONTINUED: metFORMIN (GLUCOPHAGE) 500 MG tablet    His diabetes is uncontrolled with the 500mg  metformin and lifestyle modifications that he is able to do.  a1c has increased from 6.7 to 7.4.  This is not inhibiting glucose production throughout the entire day.  Increasing the metformin from once a day to twice a day.  Will follow up in three months with recheck of a1c.     Hyperlipidemia - Plan: Lipid panel, POCT glucose (manual entry) Will follow up regarding  results.     Diabetes mellitus treated with oral medication - Plan: COMPLETE METABOLIC PANEL WITH GFR, POCT glucose (manual entry) Will follow up regarding results of CMET.  Hip pain, left - Plan: DG Hip Complete Left Will follow up in 2 weeks after engaging in hip strengthening exercises at home.  Patient instructed to contact the clinic in 2 weeks to monitor hopeful improvement in hip pain.  Possible that weakness of lower extremities due to deconditioning are causing poor movement mechanics causing inflammation and aggravating the nerves.  This could also be arthritis that is not detected with XRay imaging.  Possibly the sciatica, though the physical exam did not explain all of is symptoms.  Physical exam was difficult due to patient apprehension to passive ROMs.  If no improvement, will consult with orthopedist.    Influenza vaccine needed - Plan: Flu Vaccine QUAD 36+ mos IM Given today  Elevated cholesterol - Plan: atorvastatin (LIPITOR) 20 MG tablet Will follow up regarding results.    Seasonal allergies - Plan: azelastine (ASTELIN) 0.1 % nasal spray.  Needed refill.    Trena PlattStephanie Hortensia Duffin, PA-C [facility] 10/8/20157:43 PM

## 2013-12-07 NOTE — Progress Notes (Signed)
I was directly involved with the patient's care and agree with the physical, diagnosis and treatment plan.  

## 2013-12-07 NOTE — Patient Instructions (Signed)
-Return to clinic if you notice any fever, redness, swelling, or change in the intensity of pain. -Please apply these exercises to strengthen the hip.  Do 5x/week.    Hip Exercises RANGE OF MOTION (ROM) AND STRETCHING EXERCISES  These exercises may help you when beginning to rehabilitate your injury. Doing them too aggressively can worsen your condition. Complete them slowly and gently. Your symptoms may resolve with or without further involvement from your physician, physical therapist or athletic trainer. While completing these exercises, remember:   Restoring tissue flexibility helps normal motion to return to the joints. This allows healthier, less painful movement and activity.  An effective stretch should be held for at least 30 seconds.  A stretch should never be painful. You should only feel a gentle lengthening or release in the stretched tissue. If these stretches worsen your symptoms even when done gently, consult your physician, physical therapist or athletic trainer. STRETCH - Hamstrings, Supine   Lie on your back. Loop a belt or towel over the ball of your right / left foot.  Straighten your right / left knee and slowly pull on the belt to raise your leg. Do not allow the right / left knee to bend. Keep your opposite leg flat on the floor.  Raise the leg until you feel a gentle stretch behind your right / left knee or thigh. Hold this position for __________ seconds. Repeat __________ times. Complete this stretch __________ times per day.  STRETCH - Hip Rotators   Lie on your back on a firm surface. Grasp your right / left knee with your right / left hand and your ankle with your opposite hand.  Keeping your hips and shoulders firmly planted, gently pull your right / left knee and rotate your lower leg toward your opposite shoulder until you feel a stretch in your buttocks.  Hold this stretch for __________ seconds. Repeat this stretch __________ times. Complete this stretch  __________ times per day. STRETCH - Hamstrings/Adductors, V-Sit   Sit on the floor with your legs extended in a large "V," keeping your knees straight.  With your head and chest upright, bend at your waist reaching for your right foot to stretch your left adductors.  You should feel a stretch in your left inner thigh. Hold for __________ seconds.  Return to the upright position to relax your leg muscles.  Continuing to keep your chest upright, bend straight forward at your waist to stretch your hamstrings.  You should feel a stretch behind both of your thighs and/or knees. Hold for __________ seconds.  Return to the upright position to relax your leg muscles.  Repeat steps 2 through 4 for opposite leg. Repeat __________ times. Complete this exercise __________ times per day.  STRETCHING - Hip Flexors, Lunge  Half kneel with your right / left knee on the floor and your opposite knee bent and directly over your ankle.  Keep good posture with your head over your shoulders. Tighten your buttocks to point your tailbone downward; this will prevent your back from arching too much.  You should feel a gentle stretch in the front of your thigh and/or hip. If you do not feel any resistance, slightly slide your opposite foot forward and then slowly lunge forward so your knee once again lines up over your ankle. Be sure your tailbone remains pointed downward.  Hold this stretch for __________ seconds. Repeat __________ times. Complete this stretch __________ times per day. STRENGTHENING EXERCISES These exercises may help you when  beginning to rehabilitate your injury. They may resolve your symptoms with or without further involvement from your physician, physical therapist or athletic trainer. While completing these exercises, remember:   Muscles can gain both the endurance and the strength needed for everyday activities through controlled exercises.  Complete these exercises as instructed by  your physician, physical therapist or athletic trainer. Progress the resistance and repetitions only as guided.  You may experience muscle soreness or fatigue, but the pain or discomfort you are trying to eliminate should never worsen during these exercises. If this pain does worsen, stop and make certain you are following the directions exactly. If the pain is still present after adjustments, discontinue the exercise until you can discuss the trouble with your clinician. STRENGTH - Hip Extensors, Bridge   Lie on your back on a firm surface. Bend your knees and place your feet flat on the floor.  Tighten your buttocks muscles and lift your bottom off the floor until your trunk is level with your thighs. You should feel the muscles in your buttocks and back of your thighs working. If you do not feel these muscles, slide your feet 1-2 inches further away from your buttocks.  Hold this position for __________ seconds.  Slowly lower your hips to the starting position and allow your buttock muscles relax completely before beginning the next repetition.  If this exercise is too easy, you may cross your arms over your chest. Repeat __________ times. Complete this exercise __________ times per day.  STRENGTH - Hip Abductors, Straight Leg Raises  Be aware of your form throughout the entire exercise so that you exercise the correct muscles. Sloppy form means that you are not strengthening the correct muscles.  Lie on your side so that your head, shoulders, knee and hip line up. You may bend your lower knee to help maintain your balance. Your right / left leg should be on top.  Roll your hips slightly forward, so that your hips are stacked directly over each other and your right / left knee is facing forward.  Lift your top leg up 4-6 inches, leading with your heel. Be sure that your foot does not drift forward or that your knee does not roll toward the ceiling.  Hold this position for __________  seconds. You should feel the muscles in your outer hip lifting (you may not notice this until your leg begins to tire).  Slowly lower your leg to the starting position. Allow the muscles to fully relax before beginning the next repetition. Repeat __________ times. Complete this exercise __________ times per day.  STRENGTH - Hip Adductors, Straight Leg Raises   Lie on your side so that your head, shoulders, knee and hip line up. You may place your upper foot in front to help maintain your balance. Your right / left leg should be on the bottom.  Roll your hips slightly forward, so that your hips are stacked directly over each other and your right / left knee is facing forward.  Tense the muscles in your inner thigh and lift your bottom leg 4-6 inches. Hold this position for __________ seconds.  Slowly lower your leg to the starting position. Allow the muscles to fully relax before beginning the next repetition. Repeat __________ times. Complete this exercise __________ times per day.  STRENGTH - Quadriceps, Straight Leg Raises  Quality counts! Watch for signs that the quadriceps muscle is working to insure you are strengthening the correct muscles and not "cheating" by substituting with  healthier muscles.  Lay on your back with your right / left leg extended and your opposite knee bent.  Tense the muscles in the front of your right / left thigh. You should see either your knee cap slide up or increased dimpling just above the knee. Your thigh may even quiver.  Tighten these muscles even more and raise your leg 4 to 6 inches off the floor. Hold for right / left seconds.  Keeping these muscles tense, lower your leg.  Relax the muscles slowly and completely in between each repetition. Repeat __________ times. Complete this exercise __________ times per day.  STRENGTH - Hip Abductors, Standing  Tie one end of a rubber exercise band/tubing to a secure surface (table, pole) and tie a loop at the  other end.  Place the loop around your right / left ankle. Keeping your ankle with the band directly opposite of the secured end, step away until there is tension in the tube/band.  Hold onto a chair as needed for balance.  Keeping your back upright, your shoulders over your hips, and your toes pointing forward, lift your right / left leg out to your side. Be sure to lift your leg with your hip muscles. Do not "throw" your leg or tip your body to lift your leg.  Slowly and with control, return to the starting position. Repeat exercise __________ times. Complete this exercise __________ times per day.  STRENGTH - Quadriceps, Squats  Stand in a door frame so that your feet and knees are in line with the frame.  Use your hands for balance, not support, on the frame.  Slowly lower your weight, bending at the hips and knees. Keep your lower legs upright so that they are parallel with the door frame. Squat only within the range that does not increase your knee pain. Never let your hips drop below your knees.  Slowly return upright, pushing with your legs, not pulling with your hands. Document Released: 03/06/2005 Document Revised: 05/11/2011 Document Reviewed: 05/31/2008 [facility] Patient Information 2015 [place], [place]. This information is not intended to replace advice given to you by your health care provider. Make sure you discuss any questions you have with your health care provider.

## 2013-12-08 LAB — COMPLETE METABOLIC PANEL WITH GFR
ALK PHOS: 54 U/L (ref 39–117)
ALT: 31 U/L (ref 0–53)
AST: 14 U/L (ref 0–37)
Albumin: 4.9 g/dL (ref 3.5–5.2)
BUN: 18 mg/dL (ref 6–23)
CHLORIDE: 103 meq/L (ref 96–112)
CO2: 28 mEq/L (ref 19–32)
CREATININE: 1 mg/dL (ref 0.50–1.35)
Calcium: 9.8 mg/dL (ref 8.4–10.5)
GFR, Est African American: >89 mL/min
GFR, Est Non African American: >89 mL/min
Glucose, Bld: 119 mg/dL — ABNORMAL HIGH (ref 70–99)
Potassium: 4.3 mEq/L (ref 3.5–5.3)
Sodium: 136 mEq/L (ref 135–145)
Total Bilirubin: 0.4 mg/dL (ref 0.2–1.2)
Total Protein: 8 g/dL (ref 6.0–8.3)

## 2013-12-08 LAB — LIPID PANEL
Cholesterol: 171 mg/dL (ref 0–200)
HDL: 48 mg/dL (ref >39)
LDL CALC: 108 mg/dL — AB (ref 0–99)
TRIGLYCERIDES: 76 mg/dL (ref <150)
Total CHOL/HDL Ratio: 3.6 Ratio
VLDL: 15 mg/dL (ref 0–40)

## 2013-12-19 NOTE — Progress Notes (Signed)
History and physical examinations reviewed in detail with Benny LennertSarah Weber, PA-C and Trena PlattStephanie English, PA-C.  L hip xray reviewed and negative. Agree with assessment and plan.

## 2013-12-21 ENCOUNTER — Ambulatory Visit: Admitting: Physician Assistant

## 2014-04-04 ENCOUNTER — Encounter: Payer: Self-pay | Admitting: Adult Health

## 2014-07-16 ENCOUNTER — Ambulatory Visit: Admitting: Adult Health

## 2014-08-27 ENCOUNTER — Ambulatory Visit: Admitting: Adult Health

## 2015-01-29 IMAGING — CR DG HIP (WITH OR WITHOUT PELVIS) 2-3V*L*
3 series · 3 of 3 positions shown · non-contrast
Comparison: None.

CLINICAL DATA: Chronic left hip pain.

EXAM:
LEFT HIP - COMPLETE 2+ VIEW

[AP (1 of 2)]
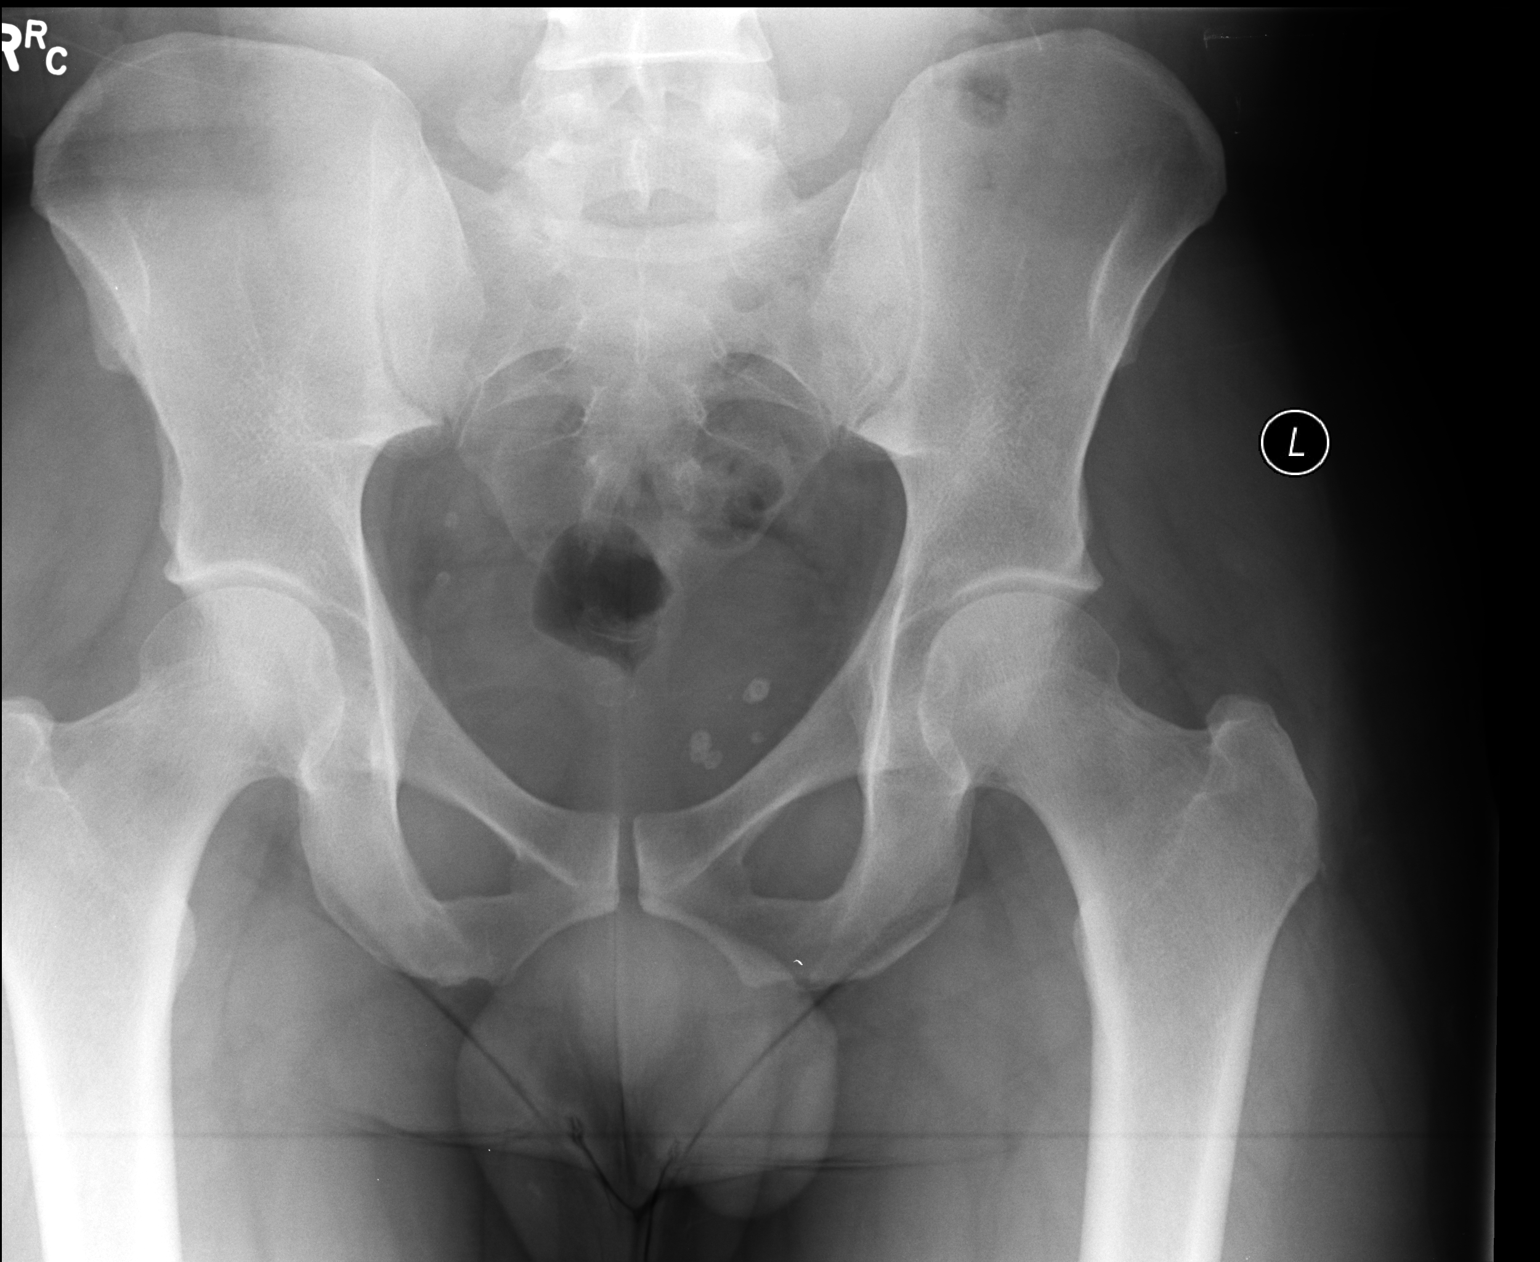

[lateral]
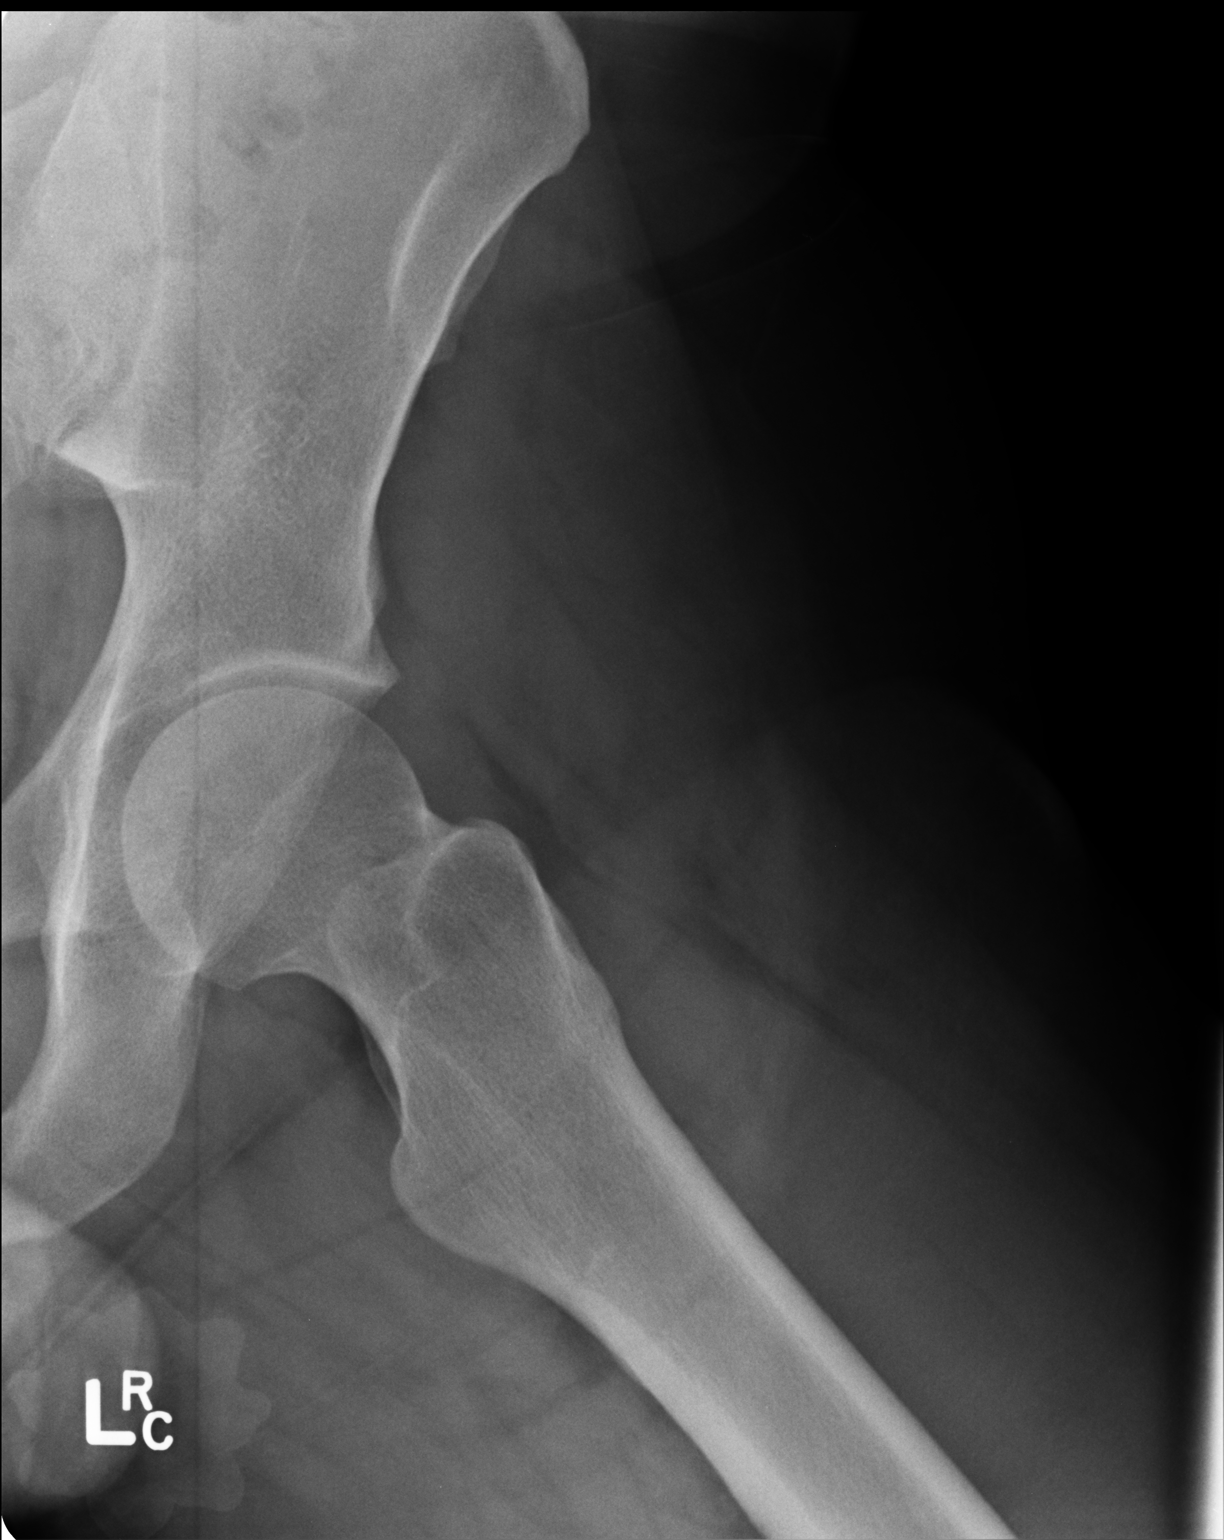

[AP (2 of 2)]
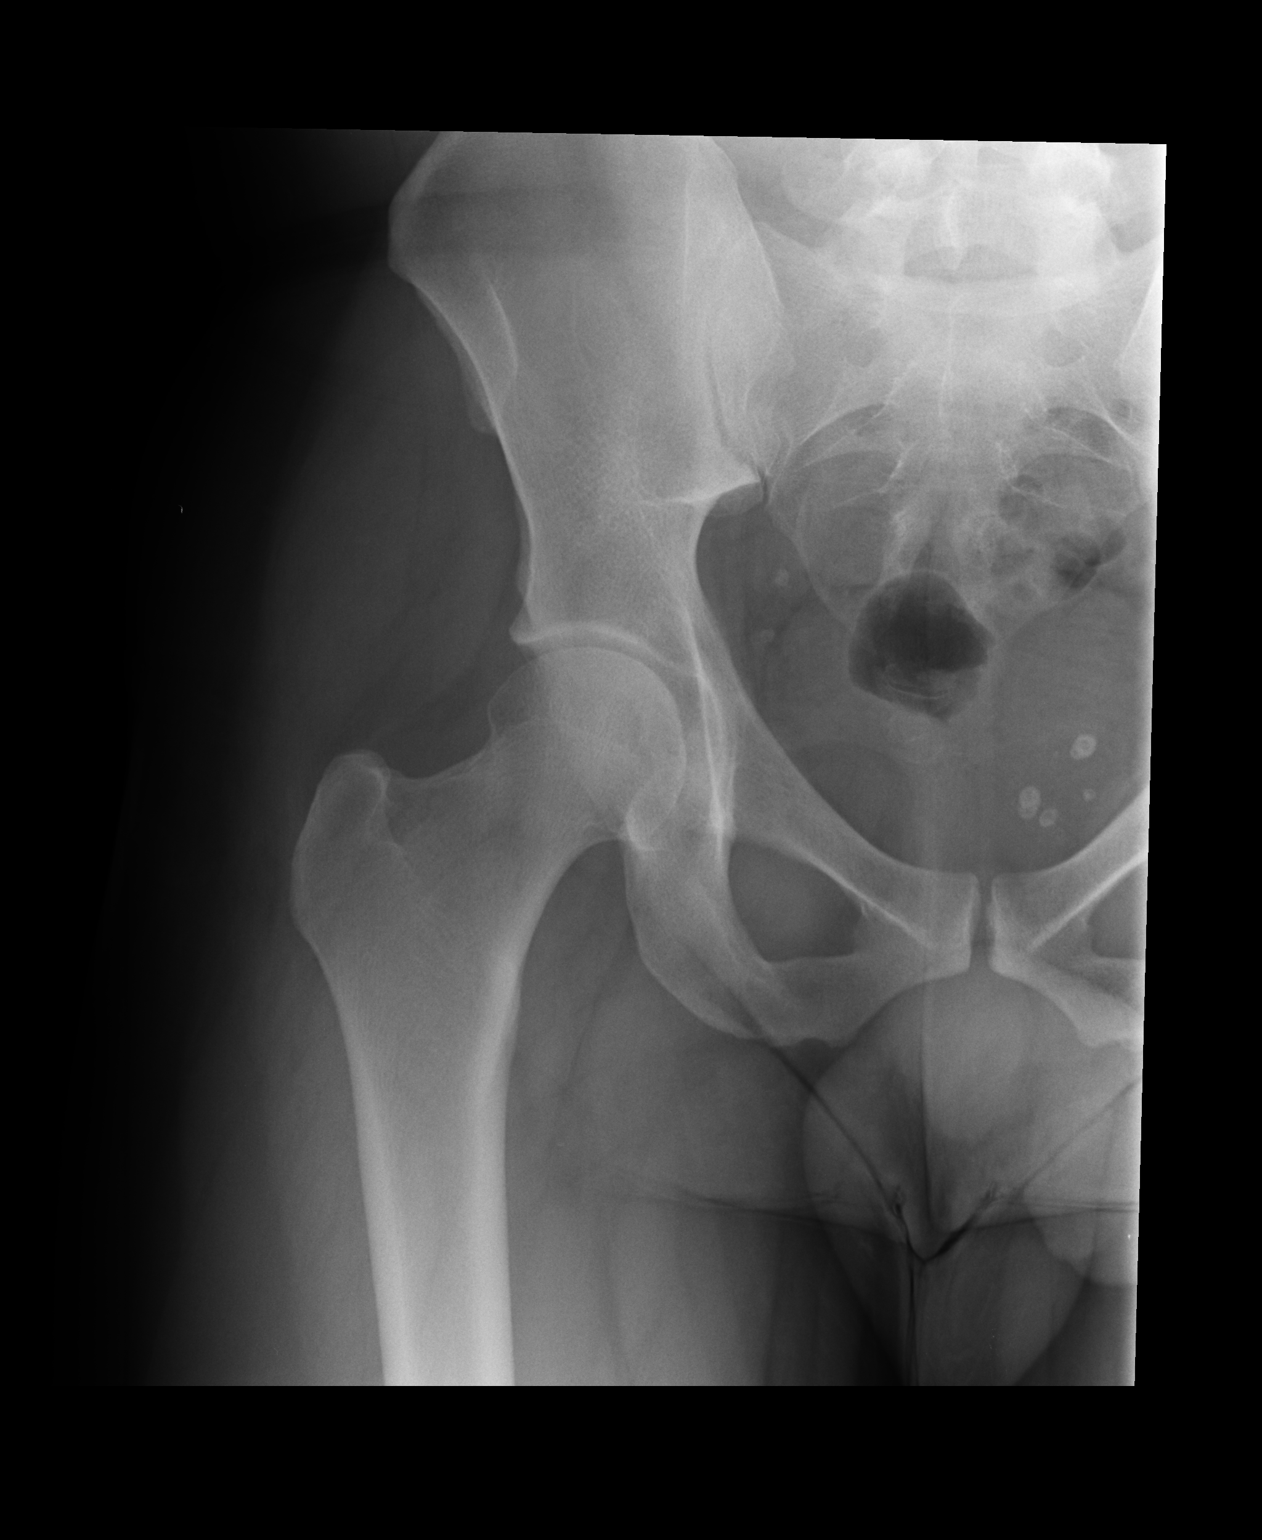

[3 of 3 positions shown; findings below may reference images not displayed]

FINDINGS: There is no evidence of hip fracture or dislocation. There is no
evidence of arthropathy or other focal bone abnormality.
IMPRESSION: Normal left hip.

## 2019-04-27 ENCOUNTER — Ambulatory Visit: Payer: Self-pay | Attending: Family

## 2019-04-27 DIAGNOSIS — Z23 Encounter for immunization: Secondary | ICD-10-CM | POA: Insufficient documentation

## 2019-04-27 NOTE — Progress Notes (Signed)
   Covid-19 Vaccination Clinic  Name:  Shawn Pollard    MRN: [id number] DOB: 1970-10-26  04/27/2019  Mr. Dicostanzo was observed post Covid-19 immunization for 15 minutes without incidence. He was provided with Vaccine Information Sheet and instruction to access the V-Safe system.   Mr. Tiegs was instructed to call 911 with any severe reactions post vaccine: Marland Kitchen Difficulty breathing  . Swelling of your face and throat  . A fast heartbeat  . A bad rash all over your body  . Dizziness and weakness    Immunizations Administered    Name Date Dose VIS Date Route   Moderna COVID-19 Vaccine 04/27/2019  2:13 PM 0.5 mL 01/31/2019 Intramuscular   Manufacturer: Moderna   Lot: [id number]   NDC: 86751-982-42

## 2019-05-30 ENCOUNTER — Ambulatory Visit: Payer: Self-pay | Attending: Family

## 2019-05-30 DIAGNOSIS — Z23 Encounter for immunization: Secondary | ICD-10-CM

## 2019-05-30 NOTE — Progress Notes (Signed)
   Covid-19 Vaccination Clinic  Name:  Shawn Pollard    MRN: [id number] DOB: [birth year]  05/30/2019  Mr. Griess was observed post Covid-19 immunization for 15 minutes without incident. He was provided with Vaccine Information Sheet and instruction to access the V-Safe system.   Mr. Salzwedel was instructed to call 911 with any severe reactions post vaccine: Marland Kitchen Difficulty breathing  . Swelling of face and throat  . A fast heartbeat  . A bad rash all over body  . Dizziness and weakness   Immunizations Administered    Name Date Dose VIS Date Route   Moderna COVID-19 Vaccine 05/30/2019 10:40 AM 0.5 mL 01/31/2019 Intramuscular   Manufacturer: Moderna   Lot: [id number]   NDC: 53794-327-61

## 2022-01-25 ENCOUNTER — Emergency Department (HOSPITAL_COMMUNITY): Payer: BC Managed Care – PPO

## 2022-01-25 ENCOUNTER — Emergency Department (HOSPITAL_COMMUNITY)
Admission: EM | Admit: 2022-01-25 | Discharge: 2022-01-25 | Disposition: A | Discharge status: Home / Self Care | Payer: BC Managed Care – PPO | Attending: Emergency Medicine | Admitting: Emergency Medicine

## 2022-01-25 ENCOUNTER — Other Ambulatory Visit: Payer: Self-pay

## 2022-01-25 DIAGNOSIS — Z7901 Long term (current) use of anticoagulants: Secondary | ICD-10-CM | POA: Diagnosis not present

## 2022-01-25 DIAGNOSIS — I4891 Unspecified atrial fibrillation: Secondary | ICD-10-CM

## 2022-01-25 DIAGNOSIS — Z7984 Long term (current) use of oral hypoglycemic drugs: Secondary | ICD-10-CM | POA: Insufficient documentation

## 2022-01-25 DIAGNOSIS — E119 Type 2 diabetes mellitus without complications: Secondary | ICD-10-CM | POA: Diagnosis not present

## 2022-01-25 DIAGNOSIS — R0789 Other chest pain: Secondary | ICD-10-CM | POA: Diagnosis present

## 2022-01-25 LAB — CBC
HCT: 42.2 % (ref 39.0–52.0)
Hemoglobin: 14 g/dL (ref 13.0–17.0)
MCH: 30 pg (ref 26.0–34.0)
MCHC: 33.2 g/dL (ref 30.0–36.0)
MCV: 90.4 fL (ref 80.0–100.0)
Platelets: 336 10*3/uL (ref 150–400)
RBC: 4.67 MIL/uL (ref 4.22–5.81)
RDW: 12.4 % (ref 11.5–15.5)
WBC: 6 10*3/uL (ref 4.0–10.5)
nRBC: 0 % (ref 0.0–0.2)

## 2022-01-25 LAB — BASIC METABOLIC PANEL
Anion gap: 16 — ABNORMAL HIGH (ref 5–15)
BUN: 17 mg/dL (ref 6–20)
CO2: 24 mmol/L (ref 22–32)
Calcium: 10.2 mg/dL (ref 8.9–10.3)
Chloride: 99 mmol/L (ref 98–111)
Creatinine, Ser: 1.33 mg/dL — ABNORMAL HIGH (ref 0.61–1.24)
GFR, Estimated: >60 mL/min (ref >60)
Glucose, Bld: 174 mg/dL — ABNORMAL HIGH (ref 70–99)
Potassium: 4.2 mmol/L (ref 3.5–5.1)
Sodium: 139 mmol/L (ref 135–145)

## 2022-01-25 LAB — MAGNESIUM: Magnesium: 2 mg/dL (ref 1.7–2.4)

## 2022-01-25 MED ORDER — APIXABAN 5 MG PO TABS
5.0000 mg | ORAL_TABLET | Freq: Once | ORAL | Status: AC
  Administered 2022-01-25: 5 mg via ORAL
  Filled 2022-01-25: qty 1

## 2022-01-25 MED ORDER — PROPOFOL 10 MG/ML IV BOLUS
40.0000 mg | Freq: Once | INTRAVENOUS | Status: AC
  Administered 2022-01-25: 40 mg via INTRAVENOUS
  Filled 2022-01-25: qty 20

## 2022-01-25 MED ORDER — APIXABAN 5 MG PO TABS: 5.0000 mg | ORAL_TABLET | Freq: Two times a day (BID) | ORAL | 0 refills | Status: DC

## 2022-01-25 MED ORDER — METOPROLOL TARTRATE 5 MG/5ML IV SOLN
5.0000 mg | Freq: Once | INTRAVENOUS | Status: AC
  Administered 2022-01-25: 5 mg via INTRAVENOUS
  Filled 2022-01-25: qty 5

## 2022-01-25 MED ORDER — SODIUM CHLORIDE 0.9 % IV BOLUS
1000.0000 mL | Freq: Once | INTRAVENOUS | Status: AC
  Administered 2022-01-25: 1000 mL via INTRAVENOUS

## 2022-01-25 NOTE — ED Notes (Signed)
Cardioversion x2 @ [address]

## 2022-01-25 NOTE — ED Provider Triage Note (Signed)
Emergency Medicine Provider Triage Evaluation Note  Shawn Pollard , a 51 y.o. male  was evaluated in triage.  Pt complains of chest tightness, feeling of heart rate sitting, abnormal heart rhythm.  Reports that he checked his watch and showed that he was in A-fib.  Patient reports that he feels like he was able to tell immediately when he went into an abnormal rhythm, and it happened around 6 PM today.  Patient reports that this is not happened previously to him ever.  He endorses history of diabetes.  He reports that he drinks about 2 beers a day, but has not had any significant change in his alcohol use over the holidays.  He reports no alcohol use today.   Review of Systems  Positive: Palpitations, dizziness Negative: Leg swelling, chest pain, fever, chills, thyroid issues, numbness, tingling  Physical Exam  BP 108/83 (BP Location: Left Arm)   Temp 98.1 F (36.7 C) (Oral)   Resp 20   SpO2 99%  Gen:   Awake, no distress   Resp:  Normal effort  MSK:   Moves extremities without difficulty  Other:  Irregularly irregular heart rate, rapid rhythm  Medical Decision Making  Medically screening exam initiated at 12:23 AM.  Appropriate orders placed.  Shawn Pollard was informed that the remainder of the evaluation will be completed by another provider, this initial triage assessment does not replace that evaluation, and the importance of remaining in the ED until their evaluation is complete.  Charge nurse informed patient should be given her a room ASAP   Olene Floss, PA-C 01/25/22 0023

## 2022-01-25 NOTE — ED Triage Notes (Signed)
Pt states about 4 pm he started to feel that his heart was racing, and that he might "pass out"  Pt states his wife is an [employer] fund[employer] and noted his heart rate sounded irregular as well as his apple watch told him he was in "afib"  pt states he has no hx of this in the past.

## 2022-01-25 NOTE — ED Notes (Addendum)
Pt now at NS rhythm

## 2022-01-25 NOTE — ED Notes (Addendum)
Cardioversion x 1 -synchronized @ [address]

## 2022-01-25 NOTE — ED Provider Notes (Addendum)
[facility] EMERGENCY DEPARTMENT Provider Note   CSN: [id number] Arrival date & time: 01/25/22  0000     History  Chief Complaint  Patient presents with   Tachycardia    Shawn Pollard is a 51 y.o. male Pt complains of chest tightness, feeling of heart rate sitting, abnormal heart rhythm.  Reports that he checked his watch and showed that he was in A-fib.  Patient reports that he feels like he was able to tell immediately when he went into an abnormal rhythm, and it happened around 6 PM today.  Patient reports that this is not happened previously to him ever.  He endorses history of diabetes.  He reports that he drinks about 2 beers a day, but has not had any significant change in his alcohol use over the holidays.  He reports no alcohol use today.   HPI     Home Medications Prior to Admission medications   Medication Sig Start Date End Date Taking? Authorizing Provider  apixaban (ELIQUIS) 5 MG TABS tablet Take 1 tablet (5 mg total) by mouth 2 (two) times daily. 01/25/22 02/24/22  Crystalynn Mcinerney H, PA-C  atorvastatin (LIPITOR) 20 MG tablet Take 1 tablet (20 mg total) by mouth daily. 12/07/13   Ivar Drape D, PA  azelastine (ASTELIN) 0.1 % nasal spray USE NASALLY AS DIRECTED 12/07/13   English, Colletta Maryland D, PA  bimatoprost (LUMIGAN) 0.03 % ophthalmic solution Place 1 drop into both eyes 2 (two) times daily.    [provider]  Brimonidine Tartrate-Timolol (COMBIGAN OP) Apply 1 drop to eye daily.    [provider]  glucose blood test strip Use as instructed 05/17/13   Weber, Damaris Hippo, PA-C  Lancets [facility] ULTRASOFT) lancets Use as instructed 05/17/13   Gale Journey, Damaris Hippo, PA-C  lisinopril (ZESTRIL) 2.5 MG tablet Take 1 tablet (2.5 mg total) by mouth daily. 12/07/13   Ivar Drape D, PA  metFORMIN (GLUCOPHAGE XR) 500 MG 24 hr tablet Take 1 tablet (500 mg total) by mouth 2 (two) times daily. 12/07/13   Ivar Drape D, PA  valACYclovir  (VALTREX) 1000 MG tablet TAKE 1/2 TABLET BY MOUTH DAILY FOR 3 DAYS AS NEEDED FOR OUTBREAK 04/06/11   Gale Journey, Damaris Hippo, PA-C      Allergies    Patient has no known allergies.    Review of Systems   Review of Systems  Cardiovascular:  Positive for palpitations.  All other systems reviewed and are negative.   Physical Exam Updated Vital Signs BP (!) 120/90   Pulse 72   Temp 97.9 F (36.6 C) (Oral)   Resp (!) 21   SpO2 100%  Physical Exam Vitals and nursing note reviewed.  Constitutional:      General: He is not in acute distress.    Appearance: Normal appearance.  HENT:     Head: Normocephalic and atraumatic.  Eyes:     General:        Right eye: No discharge.        Left eye: No discharge.  Cardiovascular:     Rate and Rhythm: Tachycardia present. Rhythm irregular.     Heart sounds: No murmur heard.    No friction rub. No gallop.  Pulmonary:     Effort: Pulmonary effort is normal.     Breath sounds: Normal breath sounds.  Abdominal:     General: Bowel sounds are normal.     Palpations: Abdomen is soft.  Skin:    General: Skin is  warm and dry.     Capillary Refill: Capillary refill takes less than 2 seconds.  Neurological:     Mental Status: He is alert and oriented to person, place, and time.  Psychiatric:        Mood and Affect: Mood normal.        Behavior: Behavior normal.     ED Results / Procedures / Treatments   Labs (all labs ordered are listed, but only abnormal results are displayed) Labs Reviewed  BASIC METABOLIC PANEL - Abnormal; Notable for the following components:      Result Value   Glucose, Bld 174 (*)    Creatinine, Ser 1.33 (*)    Anion gap 16 (*)    All other components within normal limits  MAGNESIUM  CBC    EKG EKG Interpretation  Date/Time:  Sunday January 25 2022 01:45:07 EST Ventricular Rate:  143 PR Interval:    QRS Duration: 82 QT Interval:  248 QTC Calculation: 382 R Axis:   75 Text Interpretation: Atrial fibrillation  with rapid ventricular response Possible Anterior infarct , age undetermined T wave abnormality, consider inferior ischemia Abnormal ECG Confirmed by Ripley Fraise [phone]) on 01/25/2022 2:31:28 AM  Radiology DG Chest 2 View  Result Date: 01/25/2022 CLINICAL DATA:  Chest tightness and tachycardia EXAM: CHEST - 2 VIEW COMPARISON:  None Available. FINDINGS: The heart size and mediastinal contours are within normal limits. Both lungs are clear. The visualized skeletal structures are unremarkable. IMPRESSION: No active cardiopulmonary disease. Electronically Signed   By: Inez Catalina M.D.   On: 01/25/2022 00:45    Procedures .Cardioversion  Date/Time: 01/25/2022 3:59 AM  Performed by: Anselmo Pickler, PA-C Authorized by: Anselmo Pickler, PA-C   Consent:    Consent obtained:  Verbal   Consent given by:  Patient   Risks discussed:  Cutaneous burn, death, induced arrhythmia and pain   Alternatives discussed:  No treatment, rate-control medication, anti-coagulation medication, alternative treatment, delayed treatment and observation Universal protocol:    Immediately prior to procedure a time out was called: yes     Patient identity confirmed:  Verbally with patient and arm band Pre-procedure details:    Cardioversion basis:  Elective   Rhythm:  Atrial fibrillation   Electrode placement:  Anterior-posterior Patient sedated: Yes. Refer to sedation procedure documentation for details of sedation.  Attempt one:    Cardioversion mode:  Synchronous   Waveform:  Biphasic   Shock (Joules):  100   Shock outcome:  No change in rhythm Attempt two:    Cardioversion mode:  Synchronous   Waveform:  Biphasic   Shock (Joules):  120   Shock outcome:  Conversion to normal sinus rhythm Post-procedure details:    Patient status:  Awake   Patient tolerance of procedure:  Tolerated well, no immediate complications .Critical Care  Performed by: Anselmo Pickler, PA-C Authorized by:  Anselmo Pickler, PA-C   Critical care provider statement:    Critical care time (minutes):  53   Critical care was necessary to treat or prevent imminent or life-threatening deterioration of the following conditions:  Cardiac failure   Critical care was time spent personally by me on the following activities:  Development of treatment plan with patient or surrogate, discussions with consultants, evaluation of patient's response to treatment, examination of patient, ordering and review of laboratory studies, ordering and review of radiographic studies, ordering and performing treatments and interventions, pulse oximetry, re-evaluation of patient's condition and review of old charts  Medications Ordered in ED Medications  sodium chloride 0.9 % bolus 1,000 mL (0 mLs Intravenous Stopped 01/25/22 0153)  metoprolol tartrate (LOPRESSOR) injection 5 mg (5 mg Intravenous Given 01/25/22 0113)  metoprolol tartrate (LOPRESSOR) injection 5 mg (5 mg Intravenous Given 01/25/22 0253)  propofol (DIPRIVAN) 10 mg/mL bolus/IV push 40 mg (40 mg Intravenous Given 01/25/22 0342)  apixaban (ELIQUIS) tablet 5 mg (5 mg Oral Given 01/25/22 0420)    ED Course/ Medical Decision Making/ A&P                           Medical Decision Making Amount and/or Complexity of Data Reviewed Labs: ordered. Radiology: ordered. ECG/medicine tests: ordered.  Risk Prescription drug management.   This patient is a 51 y.o. male who presents to the ED for concern of tachycardia, abnormal heart rhythm, feeling of chest tightness, this involves an extensive number of treatment options, and is a complaint that carries with it a high risk of complications and morbidity. The emergent differential diagnosis prior to evaluation includes, but is not limited to, A-fib, SVT, atypical ACS, pneumonia, acute bronchitis, versus other arrhythmia.   This is not an exhaustive differential.   Past Medical History / Co-morbidities / Social  History: Patient endorse some drinking, reports that he generally drinks around 2 beers per day, otherwise endorses history of diabetes, OSA on CPAP  Physical Exam: Physical exam performed. The pertinent findings include: Patient in irregularly irregular rhythm, otherwise no acute distress, he is tachycardic, his blood pressures are on the low end of normal, but he is not hypotensive, and he remains responsive throughout the duration of his evaluation  Lab Tests: I ordered, and personally interpreted labs.  The pertinent results include: CBC unremarkable, magnesium normal range, at 2.0 today, BMP notable for mild hyperglycemia, glucose 174, mild elevation of creatinine at 1.33, minimal elevation of anion gap, likely secondary to probable minor lactic acidosis from significant tachycardic rhythm   Imaging Studies: I ordered imaging studies including plain film chest x-ray. I independently visualized and interpreted imaging which showed no acute thoracic abnormality. I agree with the radiologist interpretation.   Cardiac Monitoring:  The patient was maintained on a cardiac monitor.  My attending physician Dr. Bebe Shaggy viewed and interpreted the cardiac monitored which showed an underlying rhythm of: Initially patient in A-fib, RVR, after cardioversion patient returned to normal sinus rhythm and is persistently in normal sinus. I agree with this interpretation.   Medications: I ordered medication including propofol for cardioversion, metoprolol to attempt rate, rhythm control prior to cardioversion, fluid bolus for rate control, and Eliquis for anticoagulation. Reevaluation of the patient after these medicines showed that the patient improved. I have reviewed the patients home medicines and have made adjustments as needed.  Consultations Obtained: I requested consultation with the pharmacist,  and discussed lab and imaging findings as well as pertinent plan - they recommend: Discharged on Eliquis,  they will provide patient counseling   Disposition: After consideration of the diagnostic results and the patients response to treatment, I feel that patient stable for disposition at this time, will follow-up with the A-fib clinic, begin taking Eliquis.  No clear reason for his new onset A-fib revealed in his workup today, low clinical suspicion for thyrotoxicosis, patient denies any increased alcohol use although this may be contributing, low suspicion for other drug use, no evidence of sepsis, PE, or other abnormality.   emergency department workup does not suggest an emergent condition requiring  admission or immediate intervention beyond what has been performed at this time. The plan is: as above. The patient is safe for discharge and has been instructed to return immediately for worsening symptoms, change in symptoms or any other concerns.  I discussed this case with my attending physician Dr. Christy Gentles who cosigned this note including patient's presenting symptoms, physical exam, and planned diagnostics and interventions. Attending physician stated agreement with plan or made changes to plan which were implemented.    Final Clinical Impression(s) / ED Diagnoses Final diagnoses:  Atrial fibrillation with RVR ([place])    Rx / DC Orders ED Discharge Orders          Ordered    Amb referral to AFIB Clinic        01/25/22 0255    Amb referral to AFIB Clinic        01/25/22 0353    apixaban (ELIQUIS) 5 MG TABS tablet  2 times daily,   Status:  Discontinued        01/25/22 0353    apixaban (ELIQUIS) 5 MG TABS tablet  2 times daily        01/25/22 0410              Leshawn Straka, [place] H, PA-C 01/25/22 0530    Anselmo Pickler, PA-C 01/25/22 0615    Ripley Fraise, MD 01/25/22 [phone]

## 2022-01-25 NOTE — ED Provider Notes (Signed)
Repeat EKG reveals sinus rhythm.  ED ECG REPORT   Date: 01/25/2022 0348am  Rate: 78  Rhythm: normal sinus rhythm  QRS Axis: normal  Intervals: normal  ST/T Wave abnormalities: normal  Conduction Disutrbances:none  Narrative Interpretation:   Old EKG Reviewed: changes noted  I have personally reviewed the EKG tracing and agree with the computerized printout as noted.      Zadie Rhine, MD 01/25/22 [id number]

## 2022-01-25 NOTE — ED Notes (Signed)
TIME OUT NOTED WITH DR Bebe Shaggy, RT, PA C HRISTIAN and myself.

## 2022-01-25 NOTE — ED Provider Notes (Signed)
.  Sedation  Date/Time: 01/25/2022 2:56 AM  Performed by: Zadie Rhine, MD Authorized by: Zadie Rhine, MD   Consent:    Consent obtained:  Written   Consent given by:  Patient   Risks discussed:  Prolonged sedation necessitating reversal, respiratory compromise necessitating ventilatory assistance and intubation and vomiting Universal protocol:    Immediately prior to procedure, a time out was called: yes     Patient identity confirmed:  Verbally with patient and provided demographic data Indications:    Procedure performed:  Cardioversion   Procedure necessitating sedation performed by:  Physician performing sedation Pre-sedation assessment:    Time since last food or drink:  8   ASA classification: class 2 - patient with mild systemic disease     Mouth opening:  3 or more finger widths   Mallampati score:  II - soft palate, uvula, fauces visible   Neck mobility: normal     Pre-sedation assessments completed and reviewed: cardiovascular function and respiratory function     Pre-sedation assessment completed:  01/25/2022 2:56 AM Immediate pre-procedure details:    Reassessment: Patient reassessed immediately prior to procedure     Reviewed: vital signs, relevant labs/tests and NPO status     Verified: bag valve mask available, emergency equipment available and oxygen available   Procedure details (see MAR for exact dosages):    Preoxygenation:  Nasal cannula   Sedation:  Propofol   Intended level of sedation: deep   Intra-procedure monitoring:  Blood pressure monitoring, cardiac monitor, frequent LOC assessments, frequent vital sign checks and continuous pulse oximetry   Intra-procedure events: none     Total Provider sedation time (minutes):  16 Post-procedure details:    Post-sedation assessment completed:  01/25/2022 4:00 AM   Attendance: Constant attendance by certified staff until patient recovered     Recovery: Patient returned to pre-procedure baseline     Patient  is stable for discharge or admission: yes     Procedure completion:  Tolerated well, no immediate complications     Zadie Rhine, MD 01/25/22 0400

## 2022-01-25 NOTE — Progress Notes (Signed)
RT attended cardioversion. Ambu and airway cart were available. Patient maintained all vitals throughout procedure. RN/MD at bedside.

## 2022-01-28 ENCOUNTER — Ambulatory Visit (HOSPITAL_COMMUNITY)
Admission: RE | Admit: 2022-01-28 | Discharge: 2022-01-28 | Disposition: A | Discharge status: Home / Self Care | Payer: BC Managed Care – PPO | Source: Ambulatory Visit | Attending: Physician Assistant | Admitting: Physician Assistant

## 2022-01-28 ENCOUNTER — Encounter (HOSPITAL_COMMUNITY): Payer: Self-pay | Admitting: Physician Assistant

## 2022-01-28 VITALS — BP 128/84 | HR 76 | Wt 213.4 lb

## 2022-01-28 DIAGNOSIS — I480 Paroxysmal atrial fibrillation: Secondary | ICD-10-CM | POA: Diagnosis not present

## 2022-01-28 DIAGNOSIS — M542 Cervicalgia: Secondary | ICD-10-CM | POA: Diagnosis not present

## 2022-01-28 DIAGNOSIS — R0602 Shortness of breath: Secondary | ICD-10-CM

## 2022-01-28 DIAGNOSIS — G4733 Obstructive sleep apnea (adult) (pediatric): Secondary | ICD-10-CM | POA: Insufficient documentation

## 2022-01-28 DIAGNOSIS — E785 Hyperlipidemia, unspecified: Secondary | ICD-10-CM | POA: Diagnosis not present

## 2022-01-28 DIAGNOSIS — Z7984 Long term (current) use of oral hypoglycemic drugs: Secondary | ICD-10-CM | POA: Diagnosis not present

## 2022-01-28 DIAGNOSIS — R0789 Other chest pain: Secondary | ICD-10-CM | POA: Insufficient documentation

## 2022-01-28 DIAGNOSIS — Z79899 Other long term (current) drug therapy: Secondary | ICD-10-CM | POA: Diagnosis not present

## 2022-01-28 DIAGNOSIS — E119 Type 2 diabetes mellitus without complications: Secondary | ICD-10-CM | POA: Diagnosis not present

## 2022-01-28 DIAGNOSIS — Z7901 Long term (current) use of anticoagulants: Secondary | ICD-10-CM | POA: Insufficient documentation

## 2022-01-28 LAB — TSH: TSH: 1.548 u[IU]/mL (ref 0.350–4.500)

## 2022-01-28 MED ORDER — METOPROLOL SUCCINATE ER 25 MG PO TB24: 25.0000 mg | ORAL_TABLET | Freq: Every day | ORAL | 11 refills | Status: DC

## 2022-01-28 MED ORDER — APIXABAN 5 MG PO TABS: 5.0000 mg | ORAL_TABLET | Freq: Two times a day (BID) | ORAL | 1 refills | Status: DC

## 2022-01-28 NOTE — Patient Instructions (Signed)
Start metoprolol 25mg once a day 

## 2022-01-28 NOTE — Progress Notes (Signed)
Primary Care Physician: Pcp, No Primary Cardiologist: none Primary Electrophysiologist: none Referring Physician: [facility]   Shawn Pollard is a 51 y.o. male with a history of DM, HLD, OSA, and atrial fibrillation who presents for consultation in the [facility].  The patient was initially diagnosed with atrial fibrillation 01/25/22 after presenting to the ED with symptoms of chest tightness and tachypalpitations. His smart watch alerted that he was in afib. ECG at the ED showed afib with RVR and he underwent DCCV. Patient was started on Eliquis for a CHADS2VASC score of 1. Per patient, he has had palpitations for a couple of years and had two stress tests at the [place] which were unremarkable.   He reports good compliance with his CPAP and drinks alcohol only infrequently. He has had a couple more episodes of afib detected on his smart watch associated with palpitations, nothing as severe as when he went to the ED. Patient does describe upper left chest/should/neck pain when turing his head to the right. He also has intermittent lightheadedness.   Today, he denies symptoms of shortness of breath, orthopnea, PND, lower extremity edema, presyncope, syncope, snoring, daytime somnolence, bleeding, or neurologic sequela. The patient is tolerating medications without difficulties and is otherwise without complaint today.    Atrial Fibrillation Risk Factors:  he does have symptoms or diagnosis of sleep apnea. he is compliant with CPAP therapy. he does not have a history of rheumatic fever. he does have a history of alcohol use. The patient does not have a history of early familial atrial fibrillation or other arrhythmias.  he has a BMI of Body mass index is 29.35 kg/m.Marland Kitchen Filed Weights   01/28/22 1114  Weight: 96.8 kg    Family History  Problem Relation Age of Onset   Diabetes Mother    Breast cancer Mother    Glaucoma Mother    Arthritis Mother    Gout Mother     Diabetes Father    Prostate cancer Father    Glaucoma Father    Arthritis Father    Gout Father    Stroke Father    Seizures Father    Heart attack Paternal Aunt    Heart attack Paternal Uncle    Prostate cancer Paternal Uncle    Diabetes Paternal Grandmother    Gout Paternal Grandmother      Atrial Fibrillation Management history:  Previous antiarrhythmic drugs: none Previous cardioversions: 01/25/22 Previous ablations: none CHADS2VASC score: 1 Anticoagulation history: Eliquis   Past Medical History:  Diagnosis Date   Allergy    Anemia    Glaucoma    No past surgical history on file.  Current Outpatient Medications  Medication Sig Dispense Refill   atorvastatin (LIPITOR) 20 MG tablet Take 1 tablet (20 mg total) by mouth daily. 90 tablet 1   azelastine (ASTELIN) 0.1 % nasal spray USE NASALLY AS DIRECTED (Patient taking differently: 2 sprays daily as needed for rhinitis. USE NASALLY AS DIRECTED) 90 mL 3   Brinzolamide-Brimonidine (SIMBRINZA) 1-0.2 % SUSP Apply 1 drop to eye in the morning, at noon, and at bedtime.     glucose blood test strip Use as instructed 100 each 12   Lancets (ONETOUCH ULTRASOFT) lancets Use as instructed 100 each 12   metFORMIN (GLUCOPHAGE XR) 500 MG 24 hr tablet Take 1 tablet (500 mg total) by mouth 2 (two) times daily. (Patient taking differently: Take by mouth. Patient takes 2 tablets twice daily.) 180 tablet 1  metoprolol succinate (TOPROL XL) 25 MG 24 hr tablet Take 1 tablet (25 mg total) by mouth daily. 30 tablet 11   Semaglutide (OZEMPIC, 2 MG/DOSE, [place]) Inject 2 mg into the skin once a week.     timolol (BETIMOL) 0.5 % ophthalmic solution Place 1 drop into both eyes daily.     valACYclovir (VALTREX) 1000 MG tablet TAKE 1/2 TABLET BY MOUTH DAILY FOR 3 DAYS AS NEEDED FOR OUTBREAK 30 tablet 1   apixaban (ELIQUIS) 5 MG TABS tablet Take 1 tablet (5 mg total) by mouth 2 (two) times daily. 180 tablet 1   No current facility-administered  medications for this encounter.    No Known Allergies  Social History   Socioeconomic History   Marital status: Single    Spouse name: Not on file   Number of children: 4   Years of education: Bachelors   Highest education level: Not on file  Occupational History    Employer: [employer]  Tobacco Use   Smoking status: Former   Smokeless tobacco: Never  Substance and Sexual Activity   Alcohol use: Yes   Drug use: No   Sexual activity: Yes    Birth control/protection: Condom  Other Topics Concern   Not on file  Social History Narrative   Patient is single.   Patient is working full-time.   Patient has four children.   Patient has a Bachelor's degree.   Patient is right-handed.   Patient drinks one cup of coffee daily.   Social Determinants of Health   Financial Resource Strain: Not on file  Food Insecurity: Not on file  Transportation Needs: Not on file  Physical Activity: Not on file  Stress: Not on file  Social Connections: Not on file  Intimate Partner Violence: Not on file     ROS- All systems are reviewed and negative except as per the HPI above.  Physical Exam: Vitals:   01/28/22 1114  BP: 128/84  Pulse: 76  SpO2: 99%  Weight: 96.8 kg    GEN- The patient is a well appearing male, alert and oriented x 3 today.   Head- normocephalic, atraumatic Eyes-  Sclera clear, conjunctiva pink Ears- hearing intact Oropharynx- clear Neck- supple  Lungs- Clear to ausculation bilaterally, normal work of breathing Heart- Regular rate and rhythm, no murmurs, rubs or gallops  GI- soft, NT, ND, + BS Extremities- no clubbing, cyanosis, or edema MS- no significant deformity or atrophy Skin- no rash or lesion Psych- euthymic mood, full affect Neuro- strength and sensation are intact  Wt Readings from Last 3 Encounters:  01/28/22 96.8 kg  12/07/13 104.3 kg  08/16/13 102.1 kg    EKG today demonstrates  SR (NST changes appear similar to ECG in  2014) Vent. rate 73 BPM PR interval 140 ms QRS duration 74 ms QT/QTcB 368/405 ms   Epic records are reviewed at length today  CHA2DS2-VASc Score = 1  The patient's score is based upon: CHF History: 0 HTN History: 0 Diabetes History: 1 Stroke History: 0 Vascular Disease History: 0 Age Score: 0 Gender Score: 0       ASSESSMENT AND PLAN: 1. Paroxysmal Atrial Fibrillation (ICD10:  I48.0) The patient's CHA2DS2-VASc score is 1, indicating a 0.6% annual risk of stroke.   General education about afib provided and questions answered. We also discussed his stroke risk and the risks and benefits of anticoagulation. Will start Toprol 25 mg daily  Check TSH today. Check echocardiogram Continue Eliquis 5 mg BID for at  least 4 weeks post DCCV.   2. Obstructive sleep apnea The importance of adequate treatment of sleep apnea was discussed today in order to improve our ability to maintain sinus rhythm long term. Patient reports compliance with CPAP therapy.  3. Chest discomfort Symptoms appear atypical as movement of neck makes his pain worse, not associated with physical activity. He did have a low risk TST at the [place] one year ago per patient report.  However, he does have risk factors for CAD including DM, HLD, and family history. Will refer to establish care with a primary cardiologist.    Follow up in the AF clinic in 3 months. Will refer to establish care with a primary cardiologist.    Adline Peals PA-C [facility] [address] [place], [place] [address] [phone] 01/28/2022 1:18 PM

## 2022-02-04 ENCOUNTER — Ambulatory Visit (HOSPITAL_COMMUNITY): Payer: BC Managed Care – PPO | Admitting: Physician Assistant

## 2022-02-09 ENCOUNTER — Ambulatory Visit (HOSPITAL_COMMUNITY)
Admission: RE | Admit: 2022-02-09 | Discharge: 2022-02-09 | Disposition: A | Discharge status: Home / Self Care | Payer: BC Managed Care – PPO | Source: Ambulatory Visit | Attending: Physician Assistant | Admitting: Physician Assistant

## 2022-02-09 DIAGNOSIS — E119 Type 2 diabetes mellitus without complications: Secondary | ICD-10-CM | POA: Diagnosis not present

## 2022-02-09 DIAGNOSIS — I480 Paroxysmal atrial fibrillation: Secondary | ICD-10-CM

## 2022-02-09 DIAGNOSIS — G4730 Sleep apnea, unspecified: Secondary | ICD-10-CM | POA: Diagnosis not present

## 2022-02-09 DIAGNOSIS — E785 Hyperlipidemia, unspecified: Secondary | ICD-10-CM | POA: Insufficient documentation

## 2022-02-09 DIAGNOSIS — I4891 Unspecified atrial fibrillation: Secondary | ICD-10-CM | POA: Diagnosis not present

## 2022-02-09 LAB — ECHOCARDIOGRAM COMPLETE
Area-P 1/2: 3.65 cm2
S' Lateral: 2.9 cm

## 2022-02-10 ENCOUNTER — Encounter (HOSPITAL_COMMUNITY): Payer: Self-pay | Admitting: *Deleted

## 2022-02-24 ENCOUNTER — Other Ambulatory Visit (HOSPITAL_COMMUNITY): Payer: Self-pay | Admitting: *Deleted

## 2022-02-24 MED ORDER — APIXABAN 5 MG PO TABS: 5.0000 mg | ORAL_TABLET | Freq: Two times a day (BID) | ORAL | 3 refills | Status: DC

## 2022-02-24 MED ORDER — METOPROLOL SUCCINATE ER 25 MG PO TB24: 25.0000 mg | ORAL_TABLET | Freq: Every day | ORAL | 6 refills | Status: AC

## 2022-03-06 ENCOUNTER — Telehealth: Payer: Self-pay | Admitting: Internal Medicine

## 2022-03-06 ENCOUNTER — Ambulatory Visit: Payer: BC Managed Care – PPO | Attending: Internal Medicine

## 2022-03-06 ENCOUNTER — Encounter: Payer: Self-pay | Admitting: Internal Medicine

## 2022-03-06 ENCOUNTER — Ambulatory Visit: Payer: BC Managed Care – PPO | Attending: Internal Medicine | Admitting: Internal Medicine

## 2022-03-06 VITALS — BP 130/86 | HR 79 | Ht 71.5 in | Wt 214.8 lb

## 2022-03-06 DIAGNOSIS — I480 Paroxysmal atrial fibrillation: Secondary | ICD-10-CM | POA: Diagnosis not present

## 2022-03-06 MED ORDER — FREESTYLE LIBRE 3 SENSOR MISC: 1.0000 | 11 refills | Status: DC

## 2022-03-06 MED ORDER — FREESTYLE LIBRE 3 READER DEVI: 1.0000 | Freq: Once | 3 refills | Status: AC

## 2022-03-06 NOTE — Progress Notes (Unsigned)
Enrolled patient for a 14 day Zio XT  monitor to be mailed to patients home  °

## 2022-03-06 NOTE — Progress Notes (Signed)
Cardiology Office Note   Date:  03/06/2022   ID:  Shawn Pollard, DOB 1970/08/09, MRN [id number]  PCP:  [facility]  Cardiologist:   Dietrich Pates, MD   Pt presents for follow up of atrial fibrillation      History of Present Illness: Shawn Pollard is a 52 y.o. male with a history of hx of palpitations  Has occurred for several years in past    Picked up fluttering on Apple watch  Lasted 5 to 10 min     Then on Jan 25, 2022 the pt developed tachycardia   Rates into 130s/140s.     Went to ER   Found to be in [employer] he was a little dizzy    Pt underwent cardioversion   Placed on Eliquis and metoprolol   (CHADSVASC score 1 (DM). 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   atorvastatin (LIPITOR) 20 MG tablet Take 1 tablet (20 mg total) by mouth daily.   azelastine (ASTELIN) 0.1 % nasal spray USE NASALLY AS DIRECTED   Brinzolamide-Brimonidine (SIMBRINZA) 1-0.2 % SUSP Apply 1 drop to eye in the morning, at noon, and at bedtime.   cetirizine (ZYRTEC) 10 MG tablet Take 1 tablet by mouth daily.   empagliflozin (JARDIANCE) 25 MG TABS tablet Take 25 mg by mouth daily. Pt takes half tablet daily   glipiZIDE (GLUCOTROL) 10 MG tablet Take 10 mg by mouth 2 (two) times daily before a meal.   glucose blood test strip Use as instructed   Lancets (ONETOUCH ULTRASOFT)  lancets Use as instructed   metFORMIN (GLUCOPHAGE) 1000 MG tablet Take 1,000 mg by mouth 2 (two) times daily with a meal. Pt takes 4 tablets daily   metoprolol succinate (TOPROL XL) 25 MG 24 hr tablet Take 1 tablet (25 mg total) by mouth daily.   prazosin (MINIPRESS) 2 MG capsule Take 2 mg by mouth at bedtime.   Semaglutide (OZEMPIC, 2 MG/DOSE, ) Inject 2 mg into the skin once a week.   sertraline (ZOLOFT) 100 MG tablet Take 100 mg by mouth at bedtime.   timolol (BETIMOL) 0.5 % ophthalmic solution Place 1 drop into both eyes daily.   traZODone (DESYREL) 150 MG tablet Take 150 mg by mouth at bedtime.   valACYclovir (VALTREX) 1000 MG tablet TAKE 1/2 TABLET BY MOUTH DAILY FOR 3 DAYS AS NEEDED FOR OUTBREAK   [DISCONTINUED] metFORMIN (GLUCOPHAGE XR) 500 MG 24 hr tablet Take 1 tablet (500 mg total) by mouth 2 (two) times daily.   [DISCONTINUED] metFORMIN (GLUCOPHAGE) 1000 MG tablet Take 1,000 mg  by mouth daily with breakfast.     Allergies:   Patient has no known allergies.   Past Medical History:  Diagnosis Date   Allergy    Anemia    Glaucoma     No past surgical history on file.   Social History:  The patient  reports that he has quit smoking. He has never used smokeless tobacco. He reports current alcohol use. He reports that he does not use drugs.   Family History:  The patient's family history includes Arthritis in his father and mother; Breast cancer in his mother; Diabetes in his father, mother, and paternal grandmother; Glaucoma in his father and mother; Gout in his father, mother, and paternal grandmother; Heart attack in his paternal aunt and paternal uncle; Prostate cancer in his father and paternal uncle; Seizures in his father; Stroke in his father.    ROS:  Please see the history of present illness. All other systems are reviewed and  Negative to the above problem except as noted.    PHYSICAL EXAM: VS:  BP 130/86   Pulse 79   Ht 5' 11.5" (1.816 m)   Wt 214 lb 12.8 oz  (97.4 kg)   SpO2 98%   BMI 29.54 kg/m   GEN: Overweight 52 yo  in no acute distress  HEENT: normal  Neck: no JVD, carotid bruits Cardiac: RRR; no murmur   No LE edema  Respiratory:  clear to auscultation bilaterally, GI: soft, nontender, nondistended, + BS  No hepatomegaly  MS: no deformity Moving all extremities   Skin: warm and dry, no rash Neuro:  Strength and sensation are intact Psych: euthymic mood, full affect   EKG:  EKG is ordered today.  SR   79 bpm    T wave inversion inferiorly    Echo  02/09/22    1. Left ventricular ejection fraction, by estimation, is 60 to 65%. The  left ventricle has normal function. The left ventricle has no regional  wall motion abnormalities. Left ventricular diastolic parameters were  normal.   2. Right ventricular systolic function is normal. The right ventricular  size is normal. Tricuspid regurgitation signal is inadequate for assessing  PA pressure.   3. The mitral valve is normal in structure. Trivial mitral valve  regurgitation. No evidence of mitral stenosis.   4. The aortic valve is tricuspid. Aortic valve regurgitation is not  visualized. No aortic stenosis is present.   5. The inferior vena cava is dilated in size with >50% respiratory  variability, suggesting right atrial pressure of 8 mmHg.    Lipid Panel    Component Value Date/Time   CHOL 171 12/07/2013 1737   TRIG 76 12/07/2013 1737   HDL 48 12/07/2013 1737   CHOLHDL 3.6 12/07/2013 1737   VLDL 15 12/07/2013 1737   LDLCALC 108 (H) 12/07/2013 1737      Wt Readings from Last 3 Encounters:  03/06/22 214 lb 12.8 oz (97.4 kg)  01/28/22 213 lb 6.4 oz (96.8 kg)  12/07/13 230 lb (104.3 kg)      ASSESSMENT AND PLAN:  1  PAF  PT with documented spell of afib   Felt bad   had cardioversion      On Eliquis and metoprolol now    I have reviewed his watch readings   I am not convinced he has had any further afib CHADSVASc is 1  (DM)  Need to eval if has vascular  dz  Recomm:   1.  CT calcium  score  2.  Monitor to evaluate for afib    Keep on current meds for now    2  DM   Spent a long time reviewing diet   Increase veggies,   Limit processed food, limit carbs  3  HL   ON stain  Need t oget levels       Current medicines are reviewed at length with the patient today.  The patient does not have concerns regarding medicines.  Signed, Dorris Carnes, MD  03/06/2022 1:58 PM    [facility] [place], [place], [place]  [address] Phone: [phone]; Fax: [phone]

## 2022-03-06 NOTE — Telephone Encounter (Signed)
Pt states he is returning call from the office. Unsure who it was, please advise.

## 2022-03-06 NOTE — Patient Instructions (Addendum)
Medication Instructions:   *If you need a refill on your cardiac medications before your next appointment, please call your pharmacy*   Lab Work:  If you have labs (blood work) drawn today and your tests are completely normal, you will receive your results only by: [place] (if you have MyChart) OR A paper copy in the mail If you have any lab test that is abnormal or we need to change your treatment, we will call you to review the results.   Testing/Procedures: [place] [place]- Long Term Monitor Instructions  Your physician has requested you wear a ZIO patch monitor for 14 days.  This is a single patch monitor. [employer] supplies one patch monitor per enrollment. Additional stickers are not available. Please do not apply patch if you will be having a Nuclear Stress Test,  Echocardiogram, Cardiac CT, MRI, or Chest Xray during the period you would be wearing the  monitor. The patch cannot be worn during these tests. You cannot remove and re-apply the  ZIO XT patch monitor.  Your ZIO patch monitor will be mailed 3 day USPS to your address on file. It may take 3-5 days  to receive your monitor after you have been enrolled.  Once you have received your monitor, please review the enclosed instructions. Your monitor  has already been registered assigning a specific monitor serial # to you.  Billing and Patient Assistance Program Information  We have supplied [employer] with any of your insurance information on file for billing purposes. [employer] offers a sliding scale Patient Assistance Program for patients that do not have  insurance, or whose insurance does not completely cover the cost of the ZIO monitor.  You must apply for the Patient Assistance Program to qualify for this discounted rate.  To apply, please call [employer] at [phone], select option 4, select option 2, ask to apply for  Patient Assistance Program. Theodore Demark will ask your household income, and how many people  are in your  household. They will quote your out-of-pocket cost based on that information.  [employer] will also be able to set up a 58-month interest-free payment plan if needed.  Applying the monitor   Shave hair from upper left chest.  Hold abrader disc by orange tab. Rub abrader in 40 strokes over the upper left chest as  indicated in your monitor instructions.  Clean area with 4 enclosed alcohol pads. Let dry.  Apply patch as indicated in monitor instructions. Patch will be placed under collarbone on left  side of chest with arrow pointing upward.  Rub patch adhesive wings for 2 minutes. Remove white label marked "1". Remove the white  label marked "2". Rub patch adhesive wings for 2 additional minutes.  While looking in a mirror, press and release button in center of patch. A small green light will  flash 3-4 times. This will be your only indicator that the monitor has been turned on.  Do not shower for the first 24 hours. You may shower after the first 24 hours.  Press the button if you feel a symptom. You will hear a small click. Record Date, Time and  Symptom in the Patient Logbook.  When you are ready to remove the patch, follow instructions on the last 2 pages of Patient  Logbook. Stick patch monitor onto the last page of Patient Logbook.  Place Patient Logbook in the blue and white box. Use locking tab on box and tape box closed  securely. The blue and white box has prepaid  postage on it. Please place it in the mailbox as  soon as possible. Your physician should have your test results approximately 7 days after the  monitor has been mailed back to [facility].  Call Amanda Park at [phone] if you have questions regarding  your ZIO XT patch monitor. Call them immediately if you see an orange light blinking on your  monitor.  If your monitor falls off in less than 4 days, contact our Monitor department at [phone].  If your monitor becomes loose or falls off after  4 days call [employer] at [phone] for  suggestions on securing your monitor  CALCIUM SCORE CT    Follow-Up: At [facility], you and your health needs are our priority.  As part of our continuing mission to provide you with exceptional heart care, we have created designated Provider Care Teams.  These Care Teams include your primary Cardiologist (physician) and Advanced Practice Providers (APPs -  Physician Assistants and Nurse Practitioners) who all work together to provide you with the care you need, when you need it.  We recommend signing up for the patient portal called "MyChart".  Sign up information is provided on this After Visit Summary.  MyChart is used to connect with patients for Virtual Visits (Telemedicine).  Patients are able to view lab/test results, encounter notes, upcoming appointments, etc.  Non-urgent messages can be sent to your provider as well.   To learn more about what you can do with MyChart, go to [email or web address].    Your next appointment:   4 month(s)  The format for your next appointment:   In Person  Provider:   DR Dorris Carnes    Other Instructions     Important Information About Sugar

## 2022-03-06 NOTE — Telephone Encounter (Signed)
Spoke with the pt and sent in the [employer] [place] 3 sensors with reader (2 sensors per box which equals one month) ... he will find out how much it will cost him and if too much he will try using [facility] and if still too much will let us know.

## 2022-03-12 DIAGNOSIS — I480 Paroxysmal atrial fibrillation: Secondary | ICD-10-CM | POA: Diagnosis not present

## 2022-03-26 ENCOUNTER — Telehealth: Payer: Self-pay

## 2022-03-26 NOTE — Telephone Encounter (Signed)
Pt advised his monitor results... he has Calcium Score 04/24/22.... Pt wants to let Dr Harrington Challenger know that since having the glucose monitor... he has noticed a correlation to his sugar being low and he starts having the "fluttering" feeling... he will then eat and bring up his sugar and the fluttering goes away.. he now eats more frequent small meals and he has not had any of the symptoms he was having prior to these changes.

## 2022-03-26 NOTE — Telephone Encounter (Signed)
-----  Message from Dorris Carnes V, MD sent at 03/26/2022  9:01 AM EST ----- Sinus rhythm with few short bursts SVT, longest 6 beats No afib   Keep following     Apple watch or Kardia mobile Pt should have CT calcium scre

## 2022-03-27 NOTE — Telephone Encounter (Signed)
Interesting.   How low is "low"    ANd what are his highs??   Is this a Free [employer]? Is he eatling a lot of carbs?   What about more proteins and good fats?   CUt out carbs.

## 2022-03-30 NOTE — Telephone Encounter (Signed)
My Chart message sent to the pt per his request with Dr Harrington Challenger questions re: his sugars and his symptoms.

## 2022-04-24 ENCOUNTER — Ambulatory Visit (HOSPITAL_BASED_OUTPATIENT_CLINIC_OR_DEPARTMENT_OTHER)
Admission: RE | Admit: 2022-04-24 | Discharge: 2022-04-24 | Disposition: A | Discharge status: Home / Self Care | Payer: BC Managed Care – PPO | Source: Ambulatory Visit | Attending: Internal Medicine | Admitting: Internal Medicine

## 2022-04-24 DIAGNOSIS — I480 Paroxysmal atrial fibrillation: Secondary | ICD-10-CM | POA: Insufficient documentation

## 2022-04-27 ENCOUNTER — Other Ambulatory Visit: Payer: Self-pay

## 2022-08-28 ENCOUNTER — Encounter: Payer: Self-pay | Admitting: Internal Medicine

## 2022-08-28 ENCOUNTER — Ambulatory Visit: Payer: BC Managed Care – PPO | Attending: Internal Medicine | Admitting: Internal Medicine

## 2022-08-28 VITALS — BP 102/68 | HR 75 | Ht 71.5 in | Wt 195.4 lb

## 2022-08-28 DIAGNOSIS — I480 Paroxysmal atrial fibrillation: Secondary | ICD-10-CM

## 2022-08-28 MED ORDER — FREESTYLE LIBRE 3 SENSOR MISC: 1.0000 | 11 refills | Status: DC

## 2022-08-28 NOTE — Patient Instructions (Addendum)
Medication Instructions:  Your physician recommends that you continue on your current medications as directed. Please refer to the Current Medication list given to you today.  *If you need a refill on your cardiac medications before your next appointment, please call your pharmacy*  Lab Work: None ordered today.  Testing/Procedures: None ordered today.   Follow-Up: At [facility], you and your health needs are our priority.  As part of our continuing mission to provide you with exceptional heart care, we have created designated Provider Care Teams.  These Care Teams include your primary Cardiologist (physician) and Advanced Practice Providers (APPs -  Physician Assistants and Nurse Practitioners) who all work together to provide you with the care you need, when you need it.  Your next appointment:   8 month(s) February 2025  The format for your next appointment:   In Person  Provider:   Dietrich Pates, MD {

## 2022-08-28 NOTE — Progress Notes (Signed)
Cardiology Office Note   Date:  08/28/2022   ID:  Shawn Pollard, DOB 1970/09/08, MRN [id number]  PCP:  [facility]  Cardiologist:   Dietrich Pates, MD   Pt presents for follow up of atrial fibrillation      History of Present Illness: Shawn Pollard is a 52 y.o. male with a history of hx PAF Hx of palpitations for years prior  Then went to ER in Nov 2023.  Found to be in afib with RVR.  Underwent cardioversion  Placed on Eliquis and metoprolol (CHADSVASc score 1 (DM))     I saw the pt in January 2024   He went on to have CT  Ca score in Jan 2024.  Score was 0   No atherosclerosis   Since seen the pt says he had been doing well   Active  Walks 8 to 10,000 steps per day  Weight continues to go down on current meds  Denies palpitations/tachycardia  Monday he did have some heaviness in chest    Not associated with activity   On and off all day    Went away on own Went to [employer] the night before  Has not had any since, even with activity     Using glucomander   Says it is helping control glucose    Current Meds  Medication Sig   atorvastatin (LIPITOR) 20 MG tablet Take 1 tablet (20 mg total) by mouth daily.   azelastine (ASTELIN) 0.1 % nasal spray USE NASALLY AS DIRECTED   Brinzolamide-Brimonidine (SIMBRINZA) 1-0.2 % SUSP Apply 1 drop to eye in the morning, at noon, and at bedtime.   cetirizine (ZYRTEC) 10 MG tablet Take 1 tablet by mouth daily.   Continuous Blood Gluc Sensor (FREESTYLE LIBRE 3 SENSOR) MISC 1 Box by Does not apply route every 30 (thirty) days. Place 1 sensor on the skin every 14 days. Use to check glucose continuously   empagliflozin (JARDIANCE) 25 MG TABS tablet Take 25 mg by mouth daily. Pt takes half tablet daily   glipiZIDE (GLUCOTROL) 10 MG tablet Take 10 mg by mouth 2 (two) times daily before a meal.   glucose blood test strip Use as instructed   Lancets (ONETOUCH ULTRASOFT) lancets Use as instructed   metFORMIN (GLUCOPHAGE) 1000 MG tablet Take 1,000 mg by  mouth 2 (two) times daily with a meal. Pt takes 4 tablets daily   metoprolol succinate (TOPROL XL) 25 MG 24 hr tablet Take 1 tablet (25 mg total) by mouth daily.   prazosin (MINIPRESS) 2 MG capsule Take 2 mg by mouth at bedtime.   Semaglutide (OZEMPIC, 2 MG/DOSE, [place]) Inject 2 mg into the skin once a week.   sertraline (ZOLOFT) 100 MG tablet Take 100 mg by mouth at bedtime.   timolol (BETIMOL) 0.5 % ophthalmic solution Place 1 drop into both eyes daily.   traZODone (DESYREL) 150 MG tablet Take 150 mg by mouth at bedtime.   valACYclovir (VALTREX) 1000 MG tablet TAKE 1/2 TABLET BY MOUTH DAILY FOR 3 DAYS AS NEEDED FOR OUTBREAK     Allergies:   Patient has no known allergies.   Past Medical History:  Diagnosis Date   Allergy    Anemia    Glaucoma     History reviewed. No pertinent surgical history.   Social History:  The patient  reports that he has quit smoking. He has never used smokeless tobacco. He reports current alcohol use. He reports that he does not use  drugs.   Family History:  The patient's family history includes Arthritis in his father and mother; Breast cancer in his mother; Diabetes in his father, mother, and paternal grandmother; Glaucoma in his father and mother; Gout in his father, mother, and paternal grandmother; Heart attack in his paternal aunt and paternal uncle; Prostate cancer in his father and paternal uncle; Seizures in his father; Stroke in his father.    ROS:  Please see the history of present illness. All other systems are reviewed and  Negative to the above problem except as noted.    PHYSICAL EXAM: VS:  BP 102/68   Pulse 75   Ht 5' 11.5" (1.816 m)   Wt 195 lb 6.4 oz (88.6 kg)   SpO2 97%   BMI 26.87 kg/m   GEN: Overweight 52 yo  in no acute distress  HEENT: normal  Neck: no JVD, carotid bruit Cardiac: RRR; no murmur   No LE edema  Respiratory:  clear to auscultation  GI: soft, nontender  No hepatomegaly  MS: no deformity Moving all extremities       EKG:  EKG is not ordered today.  Echo  02/09/22    1. Left ventricular ejection fraction, by estimation, is 60 to 65%. The  left ventricle has normal function. The left ventricle has no regional  wall motion abnormalities. Left ventricular diastolic parameters were  normal.   2. Right ventricular systolic function is normal. The right ventricular  size is normal. Tricuspid regurgitation signal is inadequate for assessing  PA pressure.   3. The mitral valve is normal in structure. Trivial mitral valve  regurgitation. No evidence of mitral stenosis.   4. The aortic valve is tricuspid. Aortic valve regurgitation is not  visualized. No aortic stenosis is present.   5. The inferior vena cava is dilated in size with >50% respiratory  variability, suggesting right atrial pressure of 8 mmHg.    Lipid Panel    Component Value Date/Time   CHOL 171 12/07/2013 1737   TRIG 76 12/07/2013 1737   HDL 48 12/07/2013 1737   CHOLHDL 3.6 12/07/2013 1737   VLDL 15 12/07/2013 1737   LDLCALC 108 (H) 12/07/2013 1737      Wt Readings from Last 3 Encounters:  08/28/22 195 lb 6.4 oz (88.6 kg)  03/06/22 214 lb 12.8 oz (97.4 kg)  01/28/22 213 lb 6.4 oz (96.8 kg)      ASSESSMENT AND PLAN:  1  PAF  Pt has had no recurrence since seen   CHADSVASc score is 1 (DM)   No evid for vascular dz   Follow     2  DM A1C is improved   He says glucomander is really helping  Reviewed whole, minimally processed foods    3  HL   ON stain   LDL 71  HDL 41  Trig 69  Keep on same meds   Stay active   Will follow up in clinic next year     Current medicines are reviewed at length with the patient today.  The patient does not have concerns regarding medicines.  Signed, Dietrich Pates, MD  08/28/2022 8:09 AM    [facility] [address] [place], [place], [place]  [address] Phone: [phone]; Fax: [phone]

## 2023-08-04 ENCOUNTER — Other Ambulatory Visit: Payer: Self-pay | Admitting: Internal Medicine

## 2023-08-05 ENCOUNTER — Telehealth: Payer: Self-pay | Admitting: Internal Medicine

## 2023-08-05 MED ORDER — FREESTYLE LIBRE 3 SENSOR MISC: 1.0000 | 3 refills | Status: DC

## 2023-08-05 NOTE — Telephone Encounter (Signed)
 Rx sent in for the pt.. Dr Avanell Bob initially started the pt on it for glucose monitoring and diet modification.

## 2023-08-05 NOTE — Progress Notes (Unsigned)
 Cardiology Office Note    Patient Name: Shawn Pollard Date of Encounter: 08/06/2023  Primary Care Provider:  [facility] Primary Cardiologist:  Ola Berger, MD Primary Electrophysiologist: None   Past Medical History    Past Medical History:  Diagnosis Date   Allergy    Anemia    Glaucoma     History of Present Illness  Shawn Pollard is a 53 y.o. male with a PMH of DM type II paroxysmal AF who presents today for annual follow-up.  Shawn Pollard was seen initially in 2023 to the ED in November 2023 with AF with RVR.  He underwent a DCCV and was placed on Eliquis  and metoprolol . He completed  a coronary calcium  score on 03/2022 that showed no atherosclerosis with calcium  score 0.  Previous 2D echo was completed 01/2022 that showed EF of 60 to 65% with no RWMA and no significant valve abnormalities.  He was seen initially by Dr. Avanell Bob on 03/06/2022 follow-up of atrial fibrillation.  He wore an event monitor for 14 days during visit he reported no subsequent episodes of atrial fibrillation.  He was seen for follow-up on 08/28/2022 and reported doing well with staying active walking 8-10,000 steps per day.  Shawn Pollard presents today for annual follow-up.He has a history of atrial fibrillation, initially diagnosed in 2023, for which he underwent cardioversion and was started on Eliquis  and metoprolol . He experienced a brief discontinuation of Eliquis  for about a month but was restarted on it. He continues to take Eliquis  5 mg twice daily. He experienced a brief episode of palpitations about a month ago, lasting a few minutes, but no prolonged or severe episodes since his initial diagnosis. He avoids caffeine except for Coke Zero. He has type 2 diabetes and uses a continuous glucose monitor (CGM) to manage his blood sugar levels. His A1c levels have significantly improved, now in the 6 range, compared to previous levels of 8-12. He attributes this improvement to the use of the CGM, which allows him to monitor  his blood sugar in real-time and adjust his diet accordingly. He drinks coconut water and Coke Zero regularly, and primarily drinks water. His blood pressure is usually in the 120s/70s range at home. Recent lab work showed a creatinine level of 1.1. No significant chest pain, progressive symptoms, or issues with swelling. He denies alcohol consumption and reports managing stress as a potential trigger for atrial fibrillation. He has a family history of type 1 diabetes, as his brother is on dialysis and awaiting a heart and kidney transplant. He served in [employer] for over 20 years and was diagnosed with diabetes after his service. Patient denies chest pain, palpitations, dyspnea, PND, orthopnea, nausea, vomiting, dizziness, syncope, edema, weight gain, or early satiety.  Discussed the use of AI scribe software for clinical note transcription with the patient, who gave verbal consent to proceed.  History of Present Illness   Review of Systems  Please see the history of present illness.    All other systems reviewed and are otherwise negative except as noted above.  Physical Exam     Wt Readings from Last 3 Encounters:  08/06/23 197 lb (89.4 kg)  08/28/22 195 lb 6.4 oz (88.6 kg)  03/06/22 214 lb 12.8 oz (97.4 kg)   VS: Vitals:   08/06/23 0802  BP: 132/72  Pulse: 73  SpO2: 96%  ,Body mass index is 28.27 kg/m. GEN: Well nourished, well developed in no acute distress Neck: No JVD; No carotid  bruits Pulmonary: Clear to auscultation without rales, wheezing or rhonchi  Cardiovascular: Normal rate. Regular rhythm. Normal S1. Normal S2.   Murmurs: There is no murmur.  ABDOMEN: Soft, non-tender, non-distended EXTREMITIES:  No edema; No deformity   EKG/LABS/ Recent Cardiac Studies   ECG personally reviewed by me today -sinus rhythm with rate of 71 bpm and no acute changes with TWI in lead III consistent with previous EKG.  Risk Assessment/Calculations:    CHA2DS2-VASc Score = 2    This indicates a 2.2% annual risk of stroke. The patient's score is based upon: CHF History: 0 HTN History: 1 Diabetes History: 1 Stroke History: 0 Vascular Disease History: 0 Age Score: 0 Gender Score: 0         Lab Results  Component Value Date   WBC 6.0 01/25/2022   HGB 14.0 01/25/2022   HCT 42.2 01/25/2022   MCV 90.4 01/25/2022   PLT 336 01/25/2022   Lab Results  Component Value Date   CREATININE 1.33 (H) 01/25/2022   BUN 17 01/25/2022   NA 139 01/25/2022   K 4.2 01/25/2022   CL 99 01/25/2022   CO2 24 01/25/2022   Lab Results  Component Value Date   CHOL 171 12/07/2013   HDL 48 12/07/2013   LDLCALC 108 (H) 12/07/2013   TRIG 76 12/07/2013   CHOLHDL 3.6 12/07/2013    Lab Results  Component Value Date   HGBA1C 7.4 12/07/2013   Assessment & Plan    Assessment and Plan Assessment & Plan Atrial Fibrillation Atrial fibrillation managed with Eliquis . No recent sustained episodes. EKG shows sinus rhythm. Discussed potential triggers and caffeine's role. - Continue Eliquis  5 mg twice daily. - Avoid caffeine, alcohol, dehydration, and stress. - Schedule annual EKG.  Abnormal EKG with T-wave Inversion T-wave inversion in lead III consistent with previous EKGs. Possible scarring from past AFib episodes. No alarming changes or symptoms. - Monitor with annual EKGs unless symptoms develop.  Hypertension Blood pressure slightly elevated in office, typically normal at home. Current management effective. - Recheck blood pressure to confirm home readings. - Continue current management.  Type 2 Diabetes Mellitus Well-controlled with current management. A1c <7. CGM use improving blood sugar management. - Refill diabetes testing supplies at [facility] on [address]. - Encourage continued CGM use.  1.  History of PAF: -Atrial fibrillation managed with Eliquis . No recent sustained episodes. EKG shows sinus rhythm. Discussed potential triggers and caffeine's role. - Continue  Eliquis  5 mg twice daily. - Avoid caffeine, alcohol, dehydration, and stress. - Schedule annual EKG.  2.  Hyperlipidemia: - Patient's LDL cholesterol was 78 - Continue Lipitor 20 mg daily  3.  DM type II: - Patient's last hemoglobin A1c was within goal and currently managed by PCP - Continue current treatment plan and refill freestyle libre sensor  4.Abnormal EKG with T-wave Inversion: T-wave inversion in lead III consistent with previous EKGs. Possible scarring from past AFib episodes. No alarming changes or symptoms. - Monitor with annual EKGs unless symptoms develop.  Disposition: Follow-up with Ola Berger, MD or APP in 12 months    Signed, Francene Ing, Retha Cast, NP 08/06/2023, 10:03 AM [facility]

## 2023-08-05 NOTE — Telephone Encounter (Signed)
*  STAT* If patient is at the pharmacy, call can be transferred to refill team.   1. Which medications need to be refilled? (please list name of each medication and dose if known)   Continuous Glucose Sensor (FREESTYLE LIBRE 3 SENSOR) MISC   2. Which pharmacy/location (including street and city if local pharmacy) is medication to be sent to? [id number], [place] - [address] AT [facility] [address] Phone: [phone]  Fax: [phone]     3. Do they need a 30 day or 90 day supply? 90

## 2023-08-05 NOTE — Telephone Encounter (Signed)
 Pt is requesting a refill on non medication glucose sensor. Would Dr. Avanell Bob like to reorder this non cardiac medication? Please address

## 2023-08-06 ENCOUNTER — Ambulatory Visit: Payer: Self-pay | Attending: Nurse Practitioner | Admitting: Nurse Practitioner

## 2023-08-06 ENCOUNTER — Encounter: Payer: Self-pay | Admitting: Nurse Practitioner

## 2023-08-06 VITALS — BP 132/72 | HR 73 | Ht 70.0 in | Wt 197.0 lb

## 2023-08-06 DIAGNOSIS — R9431 Abnormal electrocardiogram [ECG] [EKG]: Secondary | ICD-10-CM

## 2023-08-06 DIAGNOSIS — E118 Type 2 diabetes mellitus with unspecified complications: Secondary | ICD-10-CM | POA: Diagnosis not present

## 2023-08-06 DIAGNOSIS — I480 Paroxysmal atrial fibrillation: Secondary | ICD-10-CM | POA: Diagnosis not present

## 2023-08-06 DIAGNOSIS — E785 Hyperlipidemia, unspecified: Secondary | ICD-10-CM | POA: Diagnosis not present

## 2023-08-06 MED ORDER — FREESTYLE LIBRE 3 SENSOR MISC: 1.0000 | 3 refills | Status: DC

## 2023-08-06 NOTE — Patient Instructions (Signed)
 Medication Instructions:  Your physician recommends that you continue on your current medications as directed. Please refer to the Current Medication list given to you today.  *If you need a refill on your cardiac medications before your next appointment, please call your pharmacy*  Follow-Up: At [facility], you and your health needs are our priority.  As part of our continuing mission to provide you with exceptional heart care, our providers are all part of one team.  This team includes your primary Cardiologist (physician) and Advanced Practice Providers or APPs (Physician Assistants and Nurse Practitioners) who all work together to provide you with the care you need, when you need it.  Your next appointment:   1 year(s)  Provider:   Ola Berger, MD or Charles Connor, NP      We recommend signing up for the patient portal called "MyChart".  Sign up information is provided on this After Visit Summary.  MyChart is used to connect with patients for Virtual Visits (Telemedicine).  Patients are able to view lab/test results, encounter notes, upcoming appointments, etc.  Non-urgent messages can be sent to your provider as well.   To learn more about what you can do with MyChart, go to [email or web address].

## 2023-10-01 ENCOUNTER — Telehealth: Payer: Self-pay | Admitting: Internal Medicine

## 2023-10-01 MED ORDER — FREESTYLE LIBRE 3 PLUS SENSOR MISC: 6 refills | Status: AC

## 2023-10-01 NOTE — Telephone Encounter (Signed)
Called patient no answer.Unable to leave a message voice mail full. 

## 2023-10-01 NOTE — Telephone Encounter (Signed)
 Message sent to Dr.Ross if ok to refill Glucose Sensor.

## 2023-10-01 NOTE — Telephone Encounter (Signed)
 Pt c/o medication issue:  1. Name of Medication:   Continuous Glucose Sensor (FREESTYLE LIBRE 3 SENSOR) MISC    2. How are you currently taking this medication (dosage and times per day)?    3. Are you having a reaction (difficulty breathing--STAT)? no  4. What is your medication issue? Patient states that he needs a new prescription for Libre 3 Plus. Please advise

## 2023-10-01 NOTE — Telephone Encounter (Signed)
 Pt returning call to a nurse

## 2023-10-01 NOTE — Telephone Encounter (Signed)
 Spoke to patient he stated Jackee Alberts NP sent in a prescription for [employer] 3.Stated they no longer make that version.He needs prescription for [facility] 3 plus.New prescription sent to his pharmacy.
